# Patient Record
Sex: Female | Born: 1935
Health system: Southern US, Community
[De-identification: ages and names within clinical notes are randomized; demographics above are authoritative.]

## PROBLEM LIST (undated history)

## (undated) DIAGNOSIS — M1990 Unspecified osteoarthritis, unspecified site: Secondary | ICD-10-CM

## (undated) DIAGNOSIS — E119 Type 2 diabetes mellitus without complications: Secondary | ICD-10-CM

## (undated) DIAGNOSIS — I10 Essential (primary) hypertension: Secondary | ICD-10-CM

## (undated) HISTORY — PX: KNEE SURGERY: SHX244

---

## 2004-11-23 ENCOUNTER — Emergency Department: Payer: Self-pay | Admitting: Emergency Medicine

## 2004-11-27 ENCOUNTER — Emergency Department (HOSPITAL_COMMUNITY): Admission: EM | Admit: 2004-11-27 | Discharge: 2004-11-27 | Payer: Self-pay | Admitting: Family Medicine

## 2006-11-19 ENCOUNTER — Emergency Department (HOSPITAL_COMMUNITY): Admission: EM | Admit: 2006-11-19 | Discharge: 2006-11-19 | Payer: Self-pay | Admitting: Emergency Medicine

## 2007-01-28 ENCOUNTER — Emergency Department (HOSPITAL_COMMUNITY): Admission: EM | Admit: 2007-01-28 | Discharge: 2007-01-28 | Payer: Self-pay | Admitting: Emergency Medicine

## 2007-12-24 ENCOUNTER — Emergency Department (HOSPITAL_COMMUNITY): Admission: EM | Admit: 2007-12-24 | Discharge: 2007-12-24 | Payer: Self-pay | Admitting: Family Medicine

## 2009-01-09 ENCOUNTER — Emergency Department: Payer: Self-pay | Admitting: Emergency Medicine

## 2011-01-26 ENCOUNTER — Inpatient Hospital Stay (INDEPENDENT_AMBULATORY_CARE_PROVIDER_SITE_OTHER)
Admission: RE | Admit: 2011-01-26 | Discharge: 2011-01-26 | Disposition: A | Discharge status: Home / Self Care | Payer: Medicare Other | Source: Ambulatory Visit | Attending: Physician Assistant | Admitting: Physician Assistant

## 2011-01-26 DIAGNOSIS — J019 Acute sinusitis, unspecified: Secondary | ICD-10-CM

## 2012-06-02 DIAGNOSIS — E119 Type 2 diabetes mellitus without complications: Secondary | ICD-10-CM | POA: Diagnosis not present

## 2012-06-02 DIAGNOSIS — D485 Neoplasm of uncertain behavior of skin: Secondary | ICD-10-CM | POA: Diagnosis not present

## 2012-06-02 DIAGNOSIS — F0390 Unspecified dementia without behavioral disturbance: Secondary | ICD-10-CM | POA: Diagnosis not present

## 2012-06-02 DIAGNOSIS — I10 Essential (primary) hypertension: Secondary | ICD-10-CM | POA: Diagnosis not present

## 2012-08-26 DIAGNOSIS — M810 Age-related osteoporosis without current pathological fracture: Secondary | ICD-10-CM | POA: Diagnosis not present

## 2012-08-26 DIAGNOSIS — M069 Rheumatoid arthritis, unspecified: Secondary | ICD-10-CM | POA: Diagnosis not present

## 2012-08-26 DIAGNOSIS — Z23 Encounter for immunization: Secondary | ICD-10-CM | POA: Diagnosis not present

## 2012-09-17 DIAGNOSIS — E119 Type 2 diabetes mellitus without complications: Secondary | ICD-10-CM | POA: Diagnosis not present

## 2012-09-17 DIAGNOSIS — I10 Essential (primary) hypertension: Secondary | ICD-10-CM | POA: Diagnosis not present

## 2012-09-17 DIAGNOSIS — D485 Neoplasm of uncertain behavior of skin: Secondary | ICD-10-CM | POA: Diagnosis not present

## 2012-10-08 DIAGNOSIS — Z96659 Presence of unspecified artificial knee joint: Secondary | ICD-10-CM | POA: Diagnosis not present

## 2012-10-08 DIAGNOSIS — R4182 Altered mental status, unspecified: Secondary | ICD-10-CM | POA: Diagnosis not present

## 2012-10-08 DIAGNOSIS — I10 Essential (primary) hypertension: Secondary | ICD-10-CM | POA: Diagnosis not present

## 2012-10-08 DIAGNOSIS — R404 Transient alteration of awareness: Secondary | ICD-10-CM | POA: Diagnosis not present

## 2012-10-08 DIAGNOSIS — Z794 Long term (current) use of insulin: Secondary | ICD-10-CM | POA: Diagnosis not present

## 2012-10-08 DIAGNOSIS — E162 Hypoglycemia, unspecified: Secondary | ICD-10-CM | POA: Diagnosis not present

## 2012-10-08 DIAGNOSIS — E1169 Type 2 diabetes mellitus with other specified complication: Secondary | ICD-10-CM | POA: Diagnosis not present

## 2013-04-30 DIAGNOSIS — E119 Type 2 diabetes mellitus without complications: Secondary | ICD-10-CM | POA: Diagnosis not present

## 2013-04-30 DIAGNOSIS — Z961 Presence of intraocular lens: Secondary | ICD-10-CM | POA: Diagnosis not present

## 2013-05-10 DIAGNOSIS — E785 Hyperlipidemia, unspecified: Secondary | ICD-10-CM | POA: Diagnosis not present

## 2013-05-10 DIAGNOSIS — E119 Type 2 diabetes mellitus without complications: Secondary | ICD-10-CM | POA: Diagnosis not present

## 2013-05-10 DIAGNOSIS — G309 Alzheimer's disease, unspecified: Secondary | ICD-10-CM | POA: Diagnosis not present

## 2013-05-10 DIAGNOSIS — F0280 Dementia in other diseases classified elsewhere without behavioral disturbance: Secondary | ICD-10-CM | POA: Diagnosis not present

## 2013-05-10 DIAGNOSIS — M1710 Unilateral primary osteoarthritis, unspecified knee: Secondary | ICD-10-CM | POA: Diagnosis not present

## 2013-05-10 DIAGNOSIS — M069 Rheumatoid arthritis, unspecified: Secondary | ICD-10-CM | POA: Diagnosis not present

## 2013-05-10 DIAGNOSIS — I10 Essential (primary) hypertension: Secondary | ICD-10-CM | POA: Diagnosis not present

## 2013-05-10 DIAGNOSIS — H6120 Impacted cerumen, unspecified ear: Secondary | ICD-10-CM | POA: Diagnosis not present

## 2013-05-26 DIAGNOSIS — H6120 Impacted cerumen, unspecified ear: Secondary | ICD-10-CM | POA: Diagnosis not present

## 2013-08-27 DIAGNOSIS — E785 Hyperlipidemia, unspecified: Secondary | ICD-10-CM | POA: Diagnosis not present

## 2013-08-27 DIAGNOSIS — E119 Type 2 diabetes mellitus without complications: Secondary | ICD-10-CM | POA: Diagnosis not present

## 2013-08-27 DIAGNOSIS — I10 Essential (primary) hypertension: Secondary | ICD-10-CM | POA: Diagnosis not present

## 2013-08-27 DIAGNOSIS — M1710 Unilateral primary osteoarthritis, unspecified knee: Secondary | ICD-10-CM | POA: Diagnosis not present

## 2013-08-27 DIAGNOSIS — Z1331 Encounter for screening for depression: Secondary | ICD-10-CM | POA: Diagnosis not present

## 2013-08-27 DIAGNOSIS — M069 Rheumatoid arthritis, unspecified: Secondary | ICD-10-CM | POA: Diagnosis not present

## 2013-08-27 DIAGNOSIS — L723 Sebaceous cyst: Secondary | ICD-10-CM | POA: Diagnosis not present

## 2013-08-27 DIAGNOSIS — H6120 Impacted cerumen, unspecified ear: Secondary | ICD-10-CM | POA: Diagnosis not present

## 2013-09-24 DIAGNOSIS — Z96659 Presence of unspecified artificial knee joint: Secondary | ICD-10-CM | POA: Diagnosis not present

## 2014-03-21 DIAGNOSIS — M069 Rheumatoid arthritis, unspecified: Secondary | ICD-10-CM | POA: Diagnosis not present

## 2014-03-21 DIAGNOSIS — IMO0001 Reserved for inherently not codable concepts without codable children: Secondary | ICD-10-CM | POA: Diagnosis not present

## 2014-03-21 DIAGNOSIS — L723 Sebaceous cyst: Secondary | ICD-10-CM | POA: Diagnosis not present

## 2014-03-21 DIAGNOSIS — F0280 Dementia in other diseases classified elsewhere without behavioral disturbance: Secondary | ICD-10-CM | POA: Diagnosis not present

## 2014-03-21 DIAGNOSIS — Z79899 Other long term (current) drug therapy: Secondary | ICD-10-CM | POA: Diagnosis not present

## 2014-03-21 DIAGNOSIS — Z6833 Body mass index (BMI) 33.0-33.9, adult: Secondary | ICD-10-CM | POA: Diagnosis not present

## 2014-03-21 DIAGNOSIS — I10 Essential (primary) hypertension: Secondary | ICD-10-CM | POA: Diagnosis not present

## 2014-03-21 DIAGNOSIS — G309 Alzheimer's disease, unspecified: Secondary | ICD-10-CM | POA: Diagnosis not present

## 2014-04-01 DIAGNOSIS — L723 Sebaceous cyst: Secondary | ICD-10-CM | POA: Diagnosis not present

## 2014-04-18 DIAGNOSIS — M25519 Pain in unspecified shoulder: Secondary | ICD-10-CM | POA: Diagnosis not present

## 2014-04-18 DIAGNOSIS — Z13820 Encounter for screening for osteoporosis: Secondary | ICD-10-CM | POA: Diagnosis not present

## 2014-04-18 DIAGNOSIS — M069 Rheumatoid arthritis, unspecified: Secondary | ICD-10-CM | POA: Diagnosis not present

## 2014-04-18 DIAGNOSIS — M7989 Other specified soft tissue disorders: Secondary | ICD-10-CM | POA: Diagnosis not present

## 2014-04-18 DIAGNOSIS — M25569 Pain in unspecified knee: Secondary | ICD-10-CM | POA: Diagnosis not present

## 2014-04-29 DIAGNOSIS — Z961 Presence of intraocular lens: Secondary | ICD-10-CM | POA: Diagnosis not present

## 2014-04-29 DIAGNOSIS — E119 Type 2 diabetes mellitus without complications: Secondary | ICD-10-CM | POA: Diagnosis not present

## 2014-04-29 DIAGNOSIS — H26499 Other secondary cataract, unspecified eye: Secondary | ICD-10-CM | POA: Diagnosis not present

## 2014-05-20 DIAGNOSIS — M069 Rheumatoid arthritis, unspecified: Secondary | ICD-10-CM | POA: Diagnosis not present

## 2014-05-20 DIAGNOSIS — M25519 Pain in unspecified shoulder: Secondary | ICD-10-CM | POA: Diagnosis not present

## 2014-05-20 DIAGNOSIS — M1990 Unspecified osteoarthritis, unspecified site: Secondary | ICD-10-CM | POA: Diagnosis not present

## 2014-05-20 DIAGNOSIS — M19019 Primary osteoarthritis, unspecified shoulder: Secondary | ICD-10-CM | POA: Diagnosis not present

## 2014-05-20 DIAGNOSIS — M7989 Other specified soft tissue disorders: Secondary | ICD-10-CM | POA: Diagnosis not present

## 2014-06-14 ENCOUNTER — Emergency Department (HOSPITAL_COMMUNITY): Payer: MEDICARE

## 2014-06-14 ENCOUNTER — Emergency Department (HOSPITAL_COMMUNITY)
Admission: EM | Admit: 2014-06-14 | Discharge: 2014-06-14 | Disposition: A | Discharge status: Home / Self Care | Payer: MEDICARE | Attending: Emergency Medicine | Admitting: Emergency Medicine

## 2014-06-14 ENCOUNTER — Encounter (HOSPITAL_COMMUNITY): Payer: Self-pay | Admitting: Emergency Medicine

## 2014-06-14 ENCOUNTER — Emergency Department (INDEPENDENT_AMBULATORY_CARE_PROVIDER_SITE_OTHER)
Admission: EM | Admit: 2014-06-14 | Discharge: 2014-06-14 | Disposition: A | Discharge status: Hospice | Payer: MEDICARE | Source: Home / Self Care | Attending: Family Medicine | Admitting: Family Medicine

## 2014-06-14 DIAGNOSIS — R0789 Other chest pain: Secondary | ICD-10-CM | POA: Diagnosis not present

## 2014-06-14 DIAGNOSIS — R079 Chest pain, unspecified: Secondary | ICD-10-CM | POA: Diagnosis not present

## 2014-06-14 DIAGNOSIS — R071 Chest pain on breathing: Secondary | ICD-10-CM | POA: Diagnosis not present

## 2014-06-14 DIAGNOSIS — E119 Type 2 diabetes mellitus without complications: Secondary | ICD-10-CM | POA: Diagnosis not present

## 2014-06-14 DIAGNOSIS — Z8739 Personal history of other diseases of the musculoskeletal system and connective tissue: Secondary | ICD-10-CM | POA: Diagnosis not present

## 2014-06-14 DIAGNOSIS — I10 Essential (primary) hypertension: Secondary | ICD-10-CM | POA: Insufficient documentation

## 2014-06-14 DIAGNOSIS — I509 Heart failure, unspecified: Secondary | ICD-10-CM | POA: Diagnosis not present

## 2014-06-14 DIAGNOSIS — Z79899 Other long term (current) drug therapy: Secondary | ICD-10-CM | POA: Insufficient documentation

## 2014-06-14 DIAGNOSIS — J9819 Other pulmonary collapse: Secondary | ICD-10-CM | POA: Diagnosis not present

## 2014-06-14 DIAGNOSIS — Z794 Long term (current) use of insulin: Secondary | ICD-10-CM | POA: Diagnosis not present

## 2014-06-14 HISTORY — DX: Essential (primary) hypertension: I10

## 2014-06-14 HISTORY — DX: Type 2 diabetes mellitus without complications: E11.9

## 2014-06-14 HISTORY — DX: Unspecified osteoarthritis, unspecified site: M19.90

## 2014-06-14 LAB — I-STAT CHEM 8, ED
BUN: 11 mg/dL (ref 6–23)
Calcium, Ion: 1.25 mmol/L (ref 1.13–1.30)
Chloride: 105 mEq/L (ref 96–112)
Creatinine, Ser: 0.9 mg/dL (ref 0.50–1.10)
Glucose, Bld: 78 mg/dL (ref 70–99)
HEMATOCRIT: 47 % — AB (ref 36.0–46.0)
HEMOGLOBIN: 16 g/dL — AB (ref 12.0–15.0)
Potassium: 4.3 mEq/L (ref 3.7–5.3)
SODIUM: 137 meq/L (ref 137–147)
TCO2: 26 mmol/L (ref 0–100)

## 2014-06-14 LAB — I-STAT TROPONIN, ED: TROPONIN I, POC: 0.02 ng/mL (ref 0.00–0.08)

## 2014-06-14 LAB — PRO B NATRIURETIC PEPTIDE: Pro B Natriuretic peptide (BNP): 169.8 pg/mL (ref 0–450)

## 2014-06-14 MED ORDER — ASPIRIN 81 MG PO CHEW
CHEWABLE_TABLET | ORAL | Status: AC
  Filled 2014-06-14: qty 4

## 2014-06-14 MED ORDER — NITROGLYCERIN 0.4 MG SL SUBL
SUBLINGUAL_TABLET | SUBLINGUAL | Status: AC
  Filled 2014-06-14: qty 1

## 2014-06-14 NOTE — ED Provider Notes (Signed)
CSN: [id number]     Arrival date & time 06/14/14  1107 History   First MD Initiated Contact with Patient 06/14/14 1128     Chief Complaint  Patient presents with  . Chest Pain   (Consider location/radiation/quality/duration/timing/severity/associated sxs/prior Treatment) Patient is a 80 y.o. female presenting with chest pain. The history is provided by the patient and a relative.  Chest Pain Pain location:  L lateral chest Pain quality: sharp   Pain quality: no tightness   Pain radiates to:  Does not radiate Pain radiates to the back: no   Pain severity:  Mild Onset quality:  Sudden Chronicity:  Recurrent (similar episode last week,) Context comment:  Onset this am on awakening of left sided cp, no assoc sx. Relieved by:  None tried Worsened by:  Nothing tried Ineffective treatments:  None tried Associated symptoms: no abdominal pain, no back pain, no dizziness, no fever, no nausea, no palpitations and no shortness of breath   Risk factors comment:  Has rheumatoid arthr. pos f/hx of cardiac disease.   History reviewed. No pertinent past medical history. No past surgical history on file. No family history on file. History  Substance Use Topics  . Smoking status: Not on file  . Smokeless tobacco: Not on file  . Alcohol Use: Not on file   OB History   Grav Para Term Preterm Abortions TAB SAB Ect Mult Living                 Review of Systems  Constitutional: Negative.  Negative for fever.  Respiratory: Negative for chest tightness, shortness of breath and wheezing.   Cardiovascular: Positive for chest pain. Negative for palpitations and leg swelling.  Gastrointestinal: Negative.  Negative for nausea and abdominal pain.  Musculoskeletal: Negative for back pain.  Neurological: Negative for dizziness.    Allergies  Review of patient's allergies indicates no known allergies.  Home Medications   Prior to Admission medications   Medication Sig Start Date End Date Taking?  Authorizing Provider  folic acid (FOLVITE) 1 MG tablet Take 1 mg by mouth daily.   Yes Historical Provider, MD  hydrALAZINE (APRESOLINE) 50 MG tablet Take 50 mg by mouth 3 (three) times daily.   Yes Historical Provider, MD  hydrochlorothiazide (HYDRODIURIL) 25 MG tablet Take 25 mg by mouth daily.   Yes Historical Provider, MD  lisinopril (PRINIVIL,ZESTRIL) 20 MG tablet Take 20 mg by mouth daily.   Yes Historical Provider, MD  methotrexate 2.5 MG tablet Take by mouth 3 (three) times a week.   Yes Historical Provider, MD  metoprolol succinate (TOPROL-XL) 100 MG 24 hr tablet Take 100 mg by mouth daily. Take with or immediately following a meal.   Yes Historical Provider, MD  pravastatin (PRAVACHOL) 80 MG tablet Take 80 mg by mouth daily.   Yes Historical Provider, MD   BP 159/97  Pulse 62  Temp(Src) 97.6 F (36.4 C) (Oral)  Resp 16  SpO2 96% Physical Exam  Nursing note and vitals reviewed. Constitutional: She is oriented to person, place, and time. She appears well-developed and well-nourished.  Eyes: Conjunctivae are normal. Pupils are equal, round, and reactive to light.  Neck: Normal range of motion. Neck supple.  Cardiovascular: Normal rate, regular rhythm, normal heart sounds and intact distal pulses.   Pulmonary/Chest: Effort normal and breath sounds normal.  Abdominal: Soft. There is no tenderness.  Musculoskeletal: She exhibits no edema.  Lymphadenopathy:    She has no cervical adenopathy.  Neurological: She is alert  and oriented to person, place, and time.  Skin: Skin is warm and dry.    ED Course  Procedures (including critical care time) Labs Review Labs Reviewed - No data to display  Imaging Review No results found.   MDM   1. Atypical chest pain    Sent for eval of 2nd episode of left sided cp, ecg-wnl, pos fam hx of heart problems, onset this am , in nad.    Billy Fischer, MD 06/14/14 [phone]

## 2014-06-14 NOTE — ED Notes (Signed)
Patient transported to X-ray 

## 2014-06-14 NOTE — ED Notes (Signed)
Daughter had reconsidered IV, transport. While this Probation officer in room assessing iv placement, daughter again decided to refuse IV placement

## 2014-06-14 NOTE — ED Notes (Addendum)
This writer went in to exam room to start SOP monitor, IV, oxygen, NTG for c/o chest pain, daughter declined to allow RN to start , as she wanted to check w PCP office . "After all, she has a 3 pm appointment to see her own MD", after brief discussion w daughter as to the benefits of therapy, still would not allow writer to continue

## 2014-06-14 NOTE — Discharge Instructions (Signed)
Chest Wall Pain °Chest wall pain is pain felt in or around the chest bones and muscles. It may take up to 6 weeks to get better. It may take longer if you are active. Chest wall pain can happen on its own. Other times, things like germs, injury, coughing, or exercise can cause the pain. °HOME CARE  °· Avoid activities that make you tired or cause pain. Try not to use your chest, belly (abdominal), or side muscles. Do not use heavy weights. °· Put ice on the sore area. °¨ Put ice in a plastic bag. °¨ Place a towel between your skin and the bag. °¨ Leave the ice on for 15-20 minutes for the first 2 days. °· Only take medicine as told by your doctor. °GET HELP RIGHT AWAY IF:  °· You have more pain or are very uncomfortable. °· You have a fever. °· Your chest pain gets worse. °· You have new problems. °· You feel sick to your stomach (nauseous) or throw up (vomit). °· You start to sweat or feel lightheaded. °· You have a cough with mucus (phlegm). °· You cough up blood. °MAKE SURE YOU:  °· Understand these instructions. °· Will watch your condition. °· Will get help right away if you are not doing well or get worse. °Document Released: 05/13/2008 Document Revised: 02/17/2012 Document Reviewed: 07/22/2011 °[employer]® Patient Information ©2015 [employer]. This information is not intended to replace advice given to you by your health care provider. Make sure you discuss any questions you have with your health care provider. ° °

## 2014-06-14 NOTE — ED Notes (Signed)
Pt via [facility] from urgent care for evaluation of cp. Pt denies any complaints at this time. Per daughter pt c/o pain in left chest increased with inspiration. Pt unable to describe nature of pain. Denies sob, diaphoresis or nausea.

## 2014-06-14 NOTE — ED Notes (Signed)
Report to [facility] , en rout to transport patient

## 2014-06-14 NOTE — ED Notes (Signed)
C/o chest pain 

## 2014-06-14 NOTE — ED Provider Notes (Signed)
CSN: [id number]     Arrival date & time 06/14/14  1317 History   First MD Initiated Contact with Patient 06/14/14 1321     Chief Complaint  Patient presents with  . Chest Pain      HPI  Pt via [facility] from urgent care for evaluation of cp. Pt denies any complaints at this time. Per daughter pt c/o pain in left chest increased with inspiration. Pt unable describe nature of pain. Denies sob, diaphoresis or nausea.  Patient had similar episode last week and went away with some Aleve.   Patient denies any leg swelling. Past Medical History  Diagnosis Date  . Diabetes mellitus without complication   . Hypertension   . Arthritis    Past Surgical History  Procedure Laterality Date  . Knee surgery     Family History  Problem Relation Age of Onset  . Diabetes Mother   . Hypertension Mother   . Diabetes Father   . Hypertension Father    History  Substance Use Topics  . Smoking status: Never Smoker   . Smokeless tobacco: Not on file  . Alcohol Use: No   OB History   Grav Para Term Preterm Abortions TAB SAB Ect Mult Living                 Review of Systems  All other systems reviewed and are negative   Allergies  Review of patient's allergies indicates no known allergies.  Home Medications   Prior to Admission medications   Medication Sig Start Date End Date Taking? Authorizing Provider  folic acid (FOLVITE) 1 MG tablet Take 1 mg by mouth daily.   Yes Historical Provider, MD  hydrALAZINE (APRESOLINE) 50 MG tablet Take 50 mg by mouth 2 (two) times daily.    Yes Historical Provider, MD  hydrochlorothiazide (HYDRODIURIL) 25 MG tablet Take 25 mg by mouth daily.   Yes Historical Provider, MD  insulin NPH Human (HUMULIN N,NOVOLIN N) 100 UNIT/ML injection Inject 45 Units into the skin 2 (two) times daily before a meal.   Yes Historical Provider, MD  lisinopril (PRINIVIL,ZESTRIL) 20 MG tablet Take 20 mg by mouth daily.   Yes Historical Provider, MD  methotrexate 2.5 MG tablet  Take 20 mg by mouth once a week. Sunday   Yes Historical Provider, MD  metoprolol succinate (TOPROL-XL) 100 MG 24 hr tablet Take 100 mg by mouth daily. Take with or immediately following a meal.   Yes Historical Provider, MD  pravastatin (PRAVACHOL) 80 MG tablet Take 80 mg by mouth daily.   Yes Historical Provider, MD   BP 171/59  Pulse 60  Resp 16  SpO2 100% Physical Exam Physical Exam  Nursing note and vitals reviewed. Constitutional: She is oriented to person, place, and time. She appears well-developed and well-nourished. No distress.  HENT:  Head: Normocephalic and atraumatic.  Eyes: Pupils are equal, round, and reactive to light.  Neck: Normal range of motion.  no JVD noted Cardiovascular: Normal rate and intact distal pulses.   Pulmonary/Chest: No respiratory distress.  Some very mild reproducible chest wall tenderness left sternal border  Abdominal: Normal appearance. She exhibits no distension.  Musculoskeletal: Normal range of motion.  no significant pedal edema noted Neurological: She is alert and oriented to person, place, and time. No cranial nerve deficit.  Skin: Skin is warm and dry. No rash noted.  Psychiatric: She has a normal mood and affect. Her behavior is normal.   ED Course  Procedures (including critical  care time) Labs Review Labs Reviewed  I-STAT CHEM 8, ED - Abnormal; Notable for the following:    Hemoglobin 16.0 (*)    HCT 47.0 (*)    All other components within normal limits  PRO B NATRIURETIC PEPTIDE  I-STAT TROPOININ, ED    Imaging Review Dg Chest 2 View  06/14/2014   CLINICAL DATA:  Left-sided chest pain suspect CHF  EXAM: CHEST  2 VIEW  COMPARISON:  Portable chest x-ray of earlier today  FINDINGS: The lungs are adequately inflated. There is minimal atelectasis at the right lung base anteriorly. The cardiac silhouette is enlarged and the pulmonary vascularity is prominent centrally without definite cephalization. There is no pleural effusion. There  is tortuosity of the descending thoracic aorta.  IMPRESSION: Low-grade compensated CHF and right basilar atelectasis are present.   Electronically Signed   By: David  Martinique   On: 06/14/2014 15:20   Dg Chest Portable 1 View  06/14/2014   CLINICAL DATA:  Left-sided chest pain with history of hypertension and diabetes  EXAM: PORTABLE CHEST - 1 VIEW  COMPARISON:  None.  FINDINGS: The lungs are adequately inflated. The interstitial markings are coarse. The cardiac silhouette is enlarged. The pulmonary vascularity is mildly prominent centrally. There is no pleural effusion. There is tortuosity of the descending thoracic aorta. There degenerative changes of the shoulders.  IMPRESSION: There is mild cardiomegaly with possible low-grade compensated CHF. If the patient can tolerate the procedure, a PA and lateral chest x-ray would be of value.   Electronically Signed   By: David  [place]ique   On: 06/14/2014 13:56     EKG Interpretation  Date: 06/14/2014  Rate: 61  Rhythm: normal sinus rhythm  QRS Axis: normal  Intervals: normal  ST/T Wave abnormalities: normal  Conduction Disutrbances: none  Narrative Interpretation: Nonspecific EKG        MDM   Final diagnoses:  Chest wall pain        Dot Lanes, MD 06/14/14 1556

## 2014-06-14 NOTE — ED Notes (Signed)
Pt alert x4 respirations easy non labored.  

## 2014-06-16 DIAGNOSIS — R071 Chest pain on breathing: Secondary | ICD-10-CM | POA: Diagnosis not present

## 2014-06-16 DIAGNOSIS — I10 Essential (primary) hypertension: Secondary | ICD-10-CM | POA: Diagnosis not present

## 2014-06-16 DIAGNOSIS — Z79899 Other long term (current) drug therapy: Secondary | ICD-10-CM | POA: Diagnosis not present

## 2014-06-16 DIAGNOSIS — Z6833 Body mass index (BMI) 33.0-33.9, adult: Secondary | ICD-10-CM | POA: Diagnosis not present

## 2014-06-17 ENCOUNTER — Other Ambulatory Visit (HOSPITAL_COMMUNITY): Payer: Self-pay | Admitting: Internal Medicine

## 2014-06-17 ENCOUNTER — Ambulatory Visit (HOSPITAL_COMMUNITY)
Admission: RE | Admit: 2014-06-17 | Discharge: 2014-06-17 | Disposition: A | Discharge status: Home / Self Care | Payer: MEDICARE | Source: Ambulatory Visit | Attending: Vascular Surgery | Admitting: Vascular Surgery

## 2014-06-17 DIAGNOSIS — R609 Edema, unspecified: Secondary | ICD-10-CM | POA: Insufficient documentation

## 2014-06-17 DIAGNOSIS — M79609 Pain in unspecified limb: Secondary | ICD-10-CM | POA: Diagnosis not present

## 2014-06-17 DIAGNOSIS — R600 Localized edema: Secondary | ICD-10-CM

## 2014-06-17 DIAGNOSIS — M79604 Pain in right leg: Secondary | ICD-10-CM

## 2014-06-17 DIAGNOSIS — M79605 Pain in left leg: Secondary | ICD-10-CM

## 2014-07-22 DIAGNOSIS — M25519 Pain in unspecified shoulder: Secondary | ICD-10-CM | POA: Diagnosis not present

## 2014-07-22 DIAGNOSIS — M7989 Other specified soft tissue disorders: Secondary | ICD-10-CM | POA: Diagnosis not present

## 2014-07-22 DIAGNOSIS — M1990 Unspecified osteoarthritis, unspecified site: Secondary | ICD-10-CM | POA: Diagnosis not present

## 2014-07-22 DIAGNOSIS — M069 Rheumatoid arthritis, unspecified: Secondary | ICD-10-CM | POA: Diagnosis not present

## 2014-08-29 ENCOUNTER — Other Ambulatory Visit: Payer: Self-pay

## 2014-08-29 DIAGNOSIS — Z1231 Encounter for screening mammogram for malignant neoplasm of breast: Secondary | ICD-10-CM

## 2014-09-02 ENCOUNTER — Ambulatory Visit: Payer: MEDICARE

## 2014-09-09 ENCOUNTER — Ambulatory Visit: Payer: MEDICARE

## 2014-09-30 ENCOUNTER — Ambulatory Visit
Admission: RE | Admit: 2014-09-30 | Discharge: 2014-09-30 | Disposition: A | Discharge status: Home / Self Care | Payer: MEDICARE | Source: Ambulatory Visit

## 2014-09-30 DIAGNOSIS — Z1231 Encounter for screening mammogram for malignant neoplasm of breast: Secondary | ICD-10-CM | POA: Diagnosis not present

## 2014-09-30 DIAGNOSIS — Z23 Encounter for immunization: Secondary | ICD-10-CM | POA: Diagnosis not present

## 2014-10-10 DIAGNOSIS — R32 Unspecified urinary incontinence: Secondary | ICD-10-CM | POA: Diagnosis not present

## 2014-10-10 DIAGNOSIS — I119 Hypertensive heart disease without heart failure: Secondary | ICD-10-CM | POA: Diagnosis not present

## 2014-10-10 DIAGNOSIS — E785 Hyperlipidemia, unspecified: Secondary | ICD-10-CM | POA: Diagnosis not present

## 2014-10-10 DIAGNOSIS — E119 Type 2 diabetes mellitus without complications: Secondary | ICD-10-CM | POA: Diagnosis not present

## 2014-12-09 DIAGNOSIS — E119 Type 2 diabetes mellitus without complications: Secondary | ICD-10-CM | POA: Diagnosis not present

## 2014-12-09 DIAGNOSIS — R32 Unspecified urinary incontinence: Secondary | ICD-10-CM | POA: Diagnosis not present

## 2014-12-09 DIAGNOSIS — I119 Hypertensive heart disease without heart failure: Secondary | ICD-10-CM | POA: Diagnosis not present

## 2014-12-09 DIAGNOSIS — E785 Hyperlipidemia, unspecified: Secondary | ICD-10-CM | POA: Diagnosis not present

## 2015-02-07 DIAGNOSIS — E119 Type 2 diabetes mellitus without complications: Secondary | ICD-10-CM | POA: Diagnosis not present

## 2015-02-07 DIAGNOSIS — E785 Hyperlipidemia, unspecified: Secondary | ICD-10-CM | POA: Diagnosis not present

## 2015-02-07 DIAGNOSIS — I119 Hypertensive heart disease without heart failure: Secondary | ICD-10-CM | POA: Diagnosis not present

## 2015-02-07 DIAGNOSIS — R32 Unspecified urinary incontinence: Secondary | ICD-10-CM | POA: Diagnosis not present

## 2015-02-13 DIAGNOSIS — G309 Alzheimer's disease, unspecified: Secondary | ICD-10-CM | POA: Diagnosis not present

## 2015-02-13 DIAGNOSIS — Z6833 Body mass index (BMI) 33.0-33.9, adult: Secondary | ICD-10-CM | POA: Diagnosis not present

## 2015-02-13 DIAGNOSIS — E119 Type 2 diabetes mellitus without complications: Secondary | ICD-10-CM | POA: Diagnosis not present

## 2015-02-13 DIAGNOSIS — M069 Rheumatoid arthritis, unspecified: Secondary | ICD-10-CM | POA: Diagnosis not present

## 2015-02-13 DIAGNOSIS — E785 Hyperlipidemia, unspecified: Secondary | ICD-10-CM | POA: Diagnosis not present

## 2015-02-13 DIAGNOSIS — E1165 Type 2 diabetes mellitus with hyperglycemia: Secondary | ICD-10-CM | POA: Diagnosis not present

## 2015-02-13 DIAGNOSIS — I10 Essential (primary) hypertension: Secondary | ICD-10-CM | POA: Diagnosis not present

## 2015-03-17 DIAGNOSIS — M25512 Pain in left shoulder: Secondary | ICD-10-CM | POA: Diagnosis not present

## 2015-03-17 DIAGNOSIS — M150 Primary generalized (osteo)arthritis: Secondary | ICD-10-CM | POA: Diagnosis not present

## 2015-03-17 DIAGNOSIS — M8580 Other specified disorders of bone density and structure, unspecified site: Secondary | ICD-10-CM | POA: Diagnosis not present

## 2015-03-17 DIAGNOSIS — M0589 Other rheumatoid arthritis with rheumatoid factor of multiple sites: Secondary | ICD-10-CM | POA: Diagnosis not present

## 2015-03-23 DIAGNOSIS — E161 Other hypoglycemia: Secondary | ICD-10-CM | POA: Diagnosis not present

## 2015-04-08 DIAGNOSIS — I119 Hypertensive heart disease without heart failure: Secondary | ICD-10-CM | POA: Diagnosis not present

## 2015-04-08 DIAGNOSIS — E785 Hyperlipidemia, unspecified: Secondary | ICD-10-CM | POA: Diagnosis not present

## 2015-04-08 DIAGNOSIS — E119 Type 2 diabetes mellitus without complications: Secondary | ICD-10-CM | POA: Diagnosis not present

## 2015-04-08 DIAGNOSIS — R32 Unspecified urinary incontinence: Secondary | ICD-10-CM | POA: Diagnosis not present

## 2015-04-28 DIAGNOSIS — L720 Epidermal cyst: Secondary | ICD-10-CM | POA: Diagnosis not present

## 2015-05-17 DIAGNOSIS — H26493 Other secondary cataract, bilateral: Secondary | ICD-10-CM | POA: Diagnosis not present

## 2015-05-17 DIAGNOSIS — Z961 Presence of intraocular lens: Secondary | ICD-10-CM | POA: Diagnosis not present

## 2015-05-17 DIAGNOSIS — E119 Type 2 diabetes mellitus without complications: Secondary | ICD-10-CM | POA: Diagnosis not present

## 2015-06-07 DIAGNOSIS — R32 Unspecified urinary incontinence: Secondary | ICD-10-CM | POA: Diagnosis not present

## 2015-06-07 DIAGNOSIS — I119 Hypertensive heart disease without heart failure: Secondary | ICD-10-CM | POA: Diagnosis not present

## 2015-06-07 DIAGNOSIS — E785 Hyperlipidemia, unspecified: Secondary | ICD-10-CM | POA: Diagnosis not present

## 2015-06-07 DIAGNOSIS — E119 Type 2 diabetes mellitus without complications: Secondary | ICD-10-CM | POA: Diagnosis not present

## 2015-06-21 DIAGNOSIS — E11311 Type 2 diabetes mellitus with unspecified diabetic retinopathy with macular edema: Secondary | ICD-10-CM | POA: Diagnosis not present

## 2015-06-21 DIAGNOSIS — E119 Type 2 diabetes mellitus without complications: Secondary | ICD-10-CM | POA: Diagnosis not present

## 2015-06-21 DIAGNOSIS — E11321 Type 2 diabetes mellitus with mild nonproliferative diabetic retinopathy with macular edema: Secondary | ICD-10-CM | POA: Diagnosis not present

## 2015-06-21 DIAGNOSIS — Z961 Presence of intraocular lens: Secondary | ICD-10-CM | POA: Diagnosis not present

## 2015-06-26 ENCOUNTER — Encounter (INDEPENDENT_AMBULATORY_CARE_PROVIDER_SITE_OTHER): Payer: MEDICARE | Admitting: Ophthalmology

## 2015-06-26 DIAGNOSIS — H35033 Hypertensive retinopathy, bilateral: Secondary | ICD-10-CM

## 2015-06-26 DIAGNOSIS — E11311 Type 2 diabetes mellitus with unspecified diabetic retinopathy with macular edema: Secondary | ICD-10-CM | POA: Diagnosis not present

## 2015-06-26 DIAGNOSIS — H43813 Vitreous degeneration, bilateral: Secondary | ICD-10-CM | POA: Diagnosis not present

## 2015-06-26 DIAGNOSIS — E11329 Type 2 diabetes mellitus with mild nonproliferative diabetic retinopathy without macular edema: Secondary | ICD-10-CM | POA: Diagnosis not present

## 2015-06-26 DIAGNOSIS — I10 Essential (primary) hypertension: Secondary | ICD-10-CM

## 2015-06-26 DIAGNOSIS — E11331 Type 2 diabetes mellitus with moderate nonproliferative diabetic retinopathy with macular edema: Secondary | ICD-10-CM

## 2015-06-27 DIAGNOSIS — M0589 Other rheumatoid arthritis with rheumatoid factor of multiple sites: Secondary | ICD-10-CM | POA: Diagnosis not present

## 2015-06-28 DIAGNOSIS — Z6833 Body mass index (BMI) 33.0-33.9, adult: Secondary | ICD-10-CM | POA: Diagnosis not present

## 2015-06-28 DIAGNOSIS — E119 Type 2 diabetes mellitus without complications: Secondary | ICD-10-CM | POA: Diagnosis not present

## 2015-06-28 DIAGNOSIS — H3532 Exudative age-related macular degeneration: Secondary | ICD-10-CM | POA: Diagnosis not present

## 2015-06-28 DIAGNOSIS — I10 Essential (primary) hypertension: Secondary | ICD-10-CM | POA: Diagnosis not present

## 2015-06-28 DIAGNOSIS — E1165 Type 2 diabetes mellitus with hyperglycemia: Secondary | ICD-10-CM | POA: Diagnosis not present

## 2015-06-28 DIAGNOSIS — H538 Other visual disturbances: Secondary | ICD-10-CM | POA: Diagnosis not present

## 2015-07-20 DIAGNOSIS — I10 Essential (primary) hypertension: Secondary | ICD-10-CM | POA: Diagnosis not present

## 2015-07-20 DIAGNOSIS — Z6833 Body mass index (BMI) 33.0-33.9, adult: Secondary | ICD-10-CM | POA: Diagnosis not present

## 2015-07-20 DIAGNOSIS — E1165 Type 2 diabetes mellitus with hyperglycemia: Secondary | ICD-10-CM | POA: Diagnosis not present

## 2015-07-20 DIAGNOSIS — R42 Dizziness and giddiness: Secondary | ICD-10-CM | POA: Diagnosis not present

## 2015-08-08 DIAGNOSIS — I10 Essential (primary) hypertension: Secondary | ICD-10-CM | POA: Diagnosis not present

## 2015-08-08 DIAGNOSIS — Z6832 Body mass index (BMI) 32.0-32.9, adult: Secondary | ICD-10-CM | POA: Diagnosis not present

## 2015-08-10 ENCOUNTER — Encounter (INDEPENDENT_AMBULATORY_CARE_PROVIDER_SITE_OTHER): Payer: MEDICARE | Admitting: Ophthalmology

## 2015-08-10 DIAGNOSIS — H35033 Hypertensive retinopathy, bilateral: Secondary | ICD-10-CM

## 2015-08-10 DIAGNOSIS — E11311 Type 2 diabetes mellitus with unspecified diabetic retinopathy with macular edema: Secondary | ICD-10-CM

## 2015-08-10 DIAGNOSIS — I10 Essential (primary) hypertension: Secondary | ICD-10-CM | POA: Diagnosis not present

## 2015-08-10 DIAGNOSIS — E11329 Type 2 diabetes mellitus with mild nonproliferative diabetic retinopathy without macular edema: Secondary | ICD-10-CM | POA: Diagnosis not present

## 2015-08-10 DIAGNOSIS — E11331 Type 2 diabetes mellitus with moderate nonproliferative diabetic retinopathy with macular edema: Secondary | ICD-10-CM

## 2015-08-10 DIAGNOSIS — H43813 Vitreous degeneration, bilateral: Secondary | ICD-10-CM | POA: Diagnosis not present

## 2015-08-24 DIAGNOSIS — E119 Type 2 diabetes mellitus without complications: Secondary | ICD-10-CM | POA: Diagnosis not present

## 2015-08-24 DIAGNOSIS — I10 Essential (primary) hypertension: Secondary | ICD-10-CM | POA: Diagnosis not present

## 2015-08-24 DIAGNOSIS — E785 Hyperlipidemia, unspecified: Secondary | ICD-10-CM | POA: Diagnosis not present

## 2015-08-28 DIAGNOSIS — I10 Essential (primary) hypertension: Secondary | ICD-10-CM | POA: Diagnosis not present

## 2015-08-28 DIAGNOSIS — E1151 Type 2 diabetes mellitus with diabetic peripheral angiopathy without gangrene: Secondary | ICD-10-CM | POA: Diagnosis not present

## 2015-08-28 DIAGNOSIS — Z1389 Encounter for screening for other disorder: Secondary | ICD-10-CM | POA: Diagnosis not present

## 2015-08-28 DIAGNOSIS — I739 Peripheral vascular disease, unspecified: Secondary | ICD-10-CM | POA: Diagnosis not present

## 2015-08-28 DIAGNOSIS — H3532 Exudative age-related macular degeneration: Secondary | ICD-10-CM | POA: Diagnosis not present

## 2015-08-28 DIAGNOSIS — G309 Alzheimer's disease, unspecified: Secondary | ICD-10-CM | POA: Diagnosis not present

## 2015-08-28 DIAGNOSIS — E785 Hyperlipidemia, unspecified: Secondary | ICD-10-CM | POA: Diagnosis not present

## 2015-08-28 DIAGNOSIS — Z6832 Body mass index (BMI) 32.0-32.9, adult: Secondary | ICD-10-CM | POA: Diagnosis not present

## 2015-08-28 DIAGNOSIS — M069 Rheumatoid arthritis, unspecified: Secondary | ICD-10-CM | POA: Diagnosis not present

## 2015-08-28 DIAGNOSIS — Z0000 Encounter for general adult medical examination without abnormal findings: Secondary | ICD-10-CM | POA: Diagnosis not present

## 2015-09-26 DIAGNOSIS — Z23 Encounter for immunization: Secondary | ICD-10-CM | POA: Diagnosis not present

## 2015-09-27 ENCOUNTER — Other Ambulatory Visit: Payer: Self-pay

## 2015-09-27 DIAGNOSIS — Z1231 Encounter for screening mammogram for malignant neoplasm of breast: Secondary | ICD-10-CM

## 2015-10-02 ENCOUNTER — Ambulatory Visit
Admission: RE | Admit: 2015-10-02 | Discharge: 2015-10-02 | Disposition: A | Discharge status: Home / Self Care | Payer: MEDICARE | Source: Ambulatory Visit

## 2015-10-02 DIAGNOSIS — Z1231 Encounter for screening mammogram for malignant neoplasm of breast: Secondary | ICD-10-CM | POA: Diagnosis not present

## 2015-10-05 DIAGNOSIS — I119 Hypertensive heart disease without heart failure: Secondary | ICD-10-CM | POA: Diagnosis not present

## 2015-10-05 DIAGNOSIS — E119 Type 2 diabetes mellitus without complications: Secondary | ICD-10-CM | POA: Diagnosis not present

## 2015-10-05 DIAGNOSIS — R32 Unspecified urinary incontinence: Secondary | ICD-10-CM | POA: Diagnosis not present

## 2015-10-05 DIAGNOSIS — E785 Hyperlipidemia, unspecified: Secondary | ICD-10-CM | POA: Diagnosis not present

## 2015-10-12 DIAGNOSIS — M0589 Other rheumatoid arthritis with rheumatoid factor of multiple sites: Secondary | ICD-10-CM | POA: Diagnosis not present

## 2015-10-12 DIAGNOSIS — M8580 Other specified disorders of bone density and structure, unspecified site: Secondary | ICD-10-CM | POA: Diagnosis not present

## 2015-10-12 DIAGNOSIS — M150 Primary generalized (osteo)arthritis: Secondary | ICD-10-CM | POA: Diagnosis not present

## 2015-10-12 DIAGNOSIS — M25512 Pain in left shoulder: Secondary | ICD-10-CM | POA: Diagnosis not present

## 2015-12-04 DIAGNOSIS — R32 Unspecified urinary incontinence: Secondary | ICD-10-CM | POA: Diagnosis not present

## 2015-12-04 DIAGNOSIS — E119 Type 2 diabetes mellitus without complications: Secondary | ICD-10-CM | POA: Diagnosis not present

## 2015-12-04 DIAGNOSIS — I119 Hypertensive heart disease without heart failure: Secondary | ICD-10-CM | POA: Diagnosis not present

## 2015-12-04 DIAGNOSIS — E784 Other hyperlipidemia: Secondary | ICD-10-CM | POA: Diagnosis not present

## 2015-12-21 ENCOUNTER — Ambulatory Visit (INDEPENDENT_AMBULATORY_CARE_PROVIDER_SITE_OTHER): Payer: MEDICARE | Admitting: Ophthalmology

## 2015-12-28 ENCOUNTER — Ambulatory Visit (INDEPENDENT_AMBULATORY_CARE_PROVIDER_SITE_OTHER): Payer: MEDICARE | Admitting: Ophthalmology

## 2015-12-28 DIAGNOSIS — H43813 Vitreous degeneration, bilateral: Secondary | ICD-10-CM

## 2015-12-28 DIAGNOSIS — E113311 Type 2 diabetes mellitus with moderate nonproliferative diabetic retinopathy with macular edema, right eye: Secondary | ICD-10-CM

## 2015-12-28 DIAGNOSIS — E11311 Type 2 diabetes mellitus with unspecified diabetic retinopathy with macular edema: Secondary | ICD-10-CM

## 2015-12-28 DIAGNOSIS — I10 Essential (primary) hypertension: Secondary | ICD-10-CM | POA: Diagnosis not present

## 2015-12-28 DIAGNOSIS — E113292 Type 2 diabetes mellitus with mild nonproliferative diabetic retinopathy without macular edema, left eye: Secondary | ICD-10-CM | POA: Diagnosis not present

## 2015-12-28 DIAGNOSIS — H35033 Hypertensive retinopathy, bilateral: Secondary | ICD-10-CM

## 2016-01-04 DIAGNOSIS — I10 Essential (primary) hypertension: Secondary | ICD-10-CM | POA: Diagnosis not present

## 2016-01-04 DIAGNOSIS — E784 Other hyperlipidemia: Secondary | ICD-10-CM | POA: Diagnosis not present

## 2016-01-04 DIAGNOSIS — Z1389 Encounter for screening for other disorder: Secondary | ICD-10-CM | POA: Diagnosis not present

## 2016-01-04 DIAGNOSIS — G308 Other Alzheimer's disease: Secondary | ICD-10-CM | POA: Diagnosis not present

## 2016-01-04 DIAGNOSIS — Z6833 Body mass index (BMI) 33.0-33.9, adult: Secondary | ICD-10-CM | POA: Diagnosis not present

## 2016-01-04 DIAGNOSIS — I739 Peripheral vascular disease, unspecified: Secondary | ICD-10-CM | POA: Diagnosis not present

## 2016-01-04 DIAGNOSIS — E1151 Type 2 diabetes mellitus with diabetic peripheral angiopathy without gangrene: Secondary | ICD-10-CM | POA: Diagnosis not present

## 2016-01-04 DIAGNOSIS — M179 Osteoarthritis of knee, unspecified: Secondary | ICD-10-CM | POA: Diagnosis not present

## 2016-01-25 DIAGNOSIS — Z6833 Body mass index (BMI) 33.0-33.9, adult: Secondary | ICD-10-CM | POA: Diagnosis not present

## 2016-01-25 DIAGNOSIS — J209 Acute bronchitis, unspecified: Secondary | ICD-10-CM | POA: Diagnosis not present

## 2016-01-25 DIAGNOSIS — R05 Cough: Secondary | ICD-10-CM | POA: Diagnosis not present

## 2016-02-02 DIAGNOSIS — E784 Other hyperlipidemia: Secondary | ICD-10-CM | POA: Diagnosis not present

## 2016-02-02 DIAGNOSIS — I119 Hypertensive heart disease without heart failure: Secondary | ICD-10-CM | POA: Diagnosis not present

## 2016-02-02 DIAGNOSIS — R32 Unspecified urinary incontinence: Secondary | ICD-10-CM | POA: Diagnosis not present

## 2016-02-02 DIAGNOSIS — E119 Type 2 diabetes mellitus without complications: Secondary | ICD-10-CM | POA: Diagnosis not present

## 2016-02-20 DIAGNOSIS — M150 Primary generalized (osteo)arthritis: Secondary | ICD-10-CM | POA: Diagnosis not present

## 2016-02-20 DIAGNOSIS — M25512 Pain in left shoulder: Secondary | ICD-10-CM | POA: Diagnosis not present

## 2016-02-20 DIAGNOSIS — Z79899 Other long term (current) drug therapy: Secondary | ICD-10-CM | POA: Diagnosis not present

## 2016-02-20 DIAGNOSIS — M8580 Other specified disorders of bone density and structure, unspecified site: Secondary | ICD-10-CM | POA: Diagnosis not present

## 2016-02-20 DIAGNOSIS — M0589 Other rheumatoid arthritis with rheumatoid factor of multiple sites: Secondary | ICD-10-CM | POA: Diagnosis not present

## 2016-04-02 DIAGNOSIS — R32 Unspecified urinary incontinence: Secondary | ICD-10-CM | POA: Diagnosis not present

## 2016-04-02 DIAGNOSIS — I119 Hypertensive heart disease without heart failure: Secondary | ICD-10-CM | POA: Diagnosis not present

## 2016-04-02 DIAGNOSIS — E784 Other hyperlipidemia: Secondary | ICD-10-CM | POA: Diagnosis not present

## 2016-04-02 DIAGNOSIS — E119 Type 2 diabetes mellitus without complications: Secondary | ICD-10-CM | POA: Diagnosis not present

## 2016-04-16 DIAGNOSIS — I7389 Other specified peripheral vascular diseases: Secondary | ICD-10-CM | POA: Diagnosis not present

## 2016-04-16 DIAGNOSIS — G308 Other Alzheimer's disease: Secondary | ICD-10-CM | POA: Diagnosis not present

## 2016-04-16 DIAGNOSIS — E784 Other hyperlipidemia: Secondary | ICD-10-CM | POA: Diagnosis not present

## 2016-04-16 DIAGNOSIS — M0689 Other specified rheumatoid arthritis, multiple sites: Secondary | ICD-10-CM | POA: Diagnosis not present

## 2016-04-16 DIAGNOSIS — E1151 Type 2 diabetes mellitus with diabetic peripheral angiopathy without gangrene: Secondary | ICD-10-CM | POA: Diagnosis not present

## 2016-04-16 DIAGNOSIS — I10 Essential (primary) hypertension: Secondary | ICD-10-CM | POA: Diagnosis not present

## 2016-04-16 DIAGNOSIS — Z6832 Body mass index (BMI) 32.0-32.9, adult: Secondary | ICD-10-CM | POA: Diagnosis not present

## 2016-05-03 DIAGNOSIS — J209 Acute bronchitis, unspecified: Secondary | ICD-10-CM | POA: Diagnosis not present

## 2016-05-03 DIAGNOSIS — Z6833 Body mass index (BMI) 33.0-33.9, adult: Secondary | ICD-10-CM | POA: Diagnosis not present

## 2016-05-23 DIAGNOSIS — H35373 Puckering of macula, bilateral: Secondary | ICD-10-CM | POA: Diagnosis not present

## 2016-05-23 DIAGNOSIS — H26492 Other secondary cataract, left eye: Secondary | ICD-10-CM | POA: Diagnosis not present

## 2016-05-23 DIAGNOSIS — Z961 Presence of intraocular lens: Secondary | ICD-10-CM | POA: Diagnosis not present

## 2016-05-23 DIAGNOSIS — E119 Type 2 diabetes mellitus without complications: Secondary | ICD-10-CM | POA: Diagnosis not present

## 2016-05-23 DIAGNOSIS — H04123 Dry eye syndrome of bilateral lacrimal glands: Secondary | ICD-10-CM | POA: Diagnosis not present

## 2016-05-27 DIAGNOSIS — M069 Rheumatoid arthritis, unspecified: Secondary | ICD-10-CM | POA: Diagnosis not present

## 2016-06-01 DIAGNOSIS — I119 Hypertensive heart disease without heart failure: Secondary | ICD-10-CM | POA: Diagnosis not present

## 2016-06-01 DIAGNOSIS — E784 Other hyperlipidemia: Secondary | ICD-10-CM | POA: Diagnosis not present

## 2016-06-01 DIAGNOSIS — R32 Unspecified urinary incontinence: Secondary | ICD-10-CM | POA: Diagnosis not present

## 2016-06-01 DIAGNOSIS — E119 Type 2 diabetes mellitus without complications: Secondary | ICD-10-CM | POA: Diagnosis not present

## 2016-06-07 IMAGING — CR DG CHEST 1V PORT
1 series · 1 of 1 positions shown · non-contrast
Comparison: None.

CLINICAL DATA: Left-sided chest pain with history of hypertension
and diabetes

EXAM:
PORTABLE CHEST - 1 VIEW

[AP]
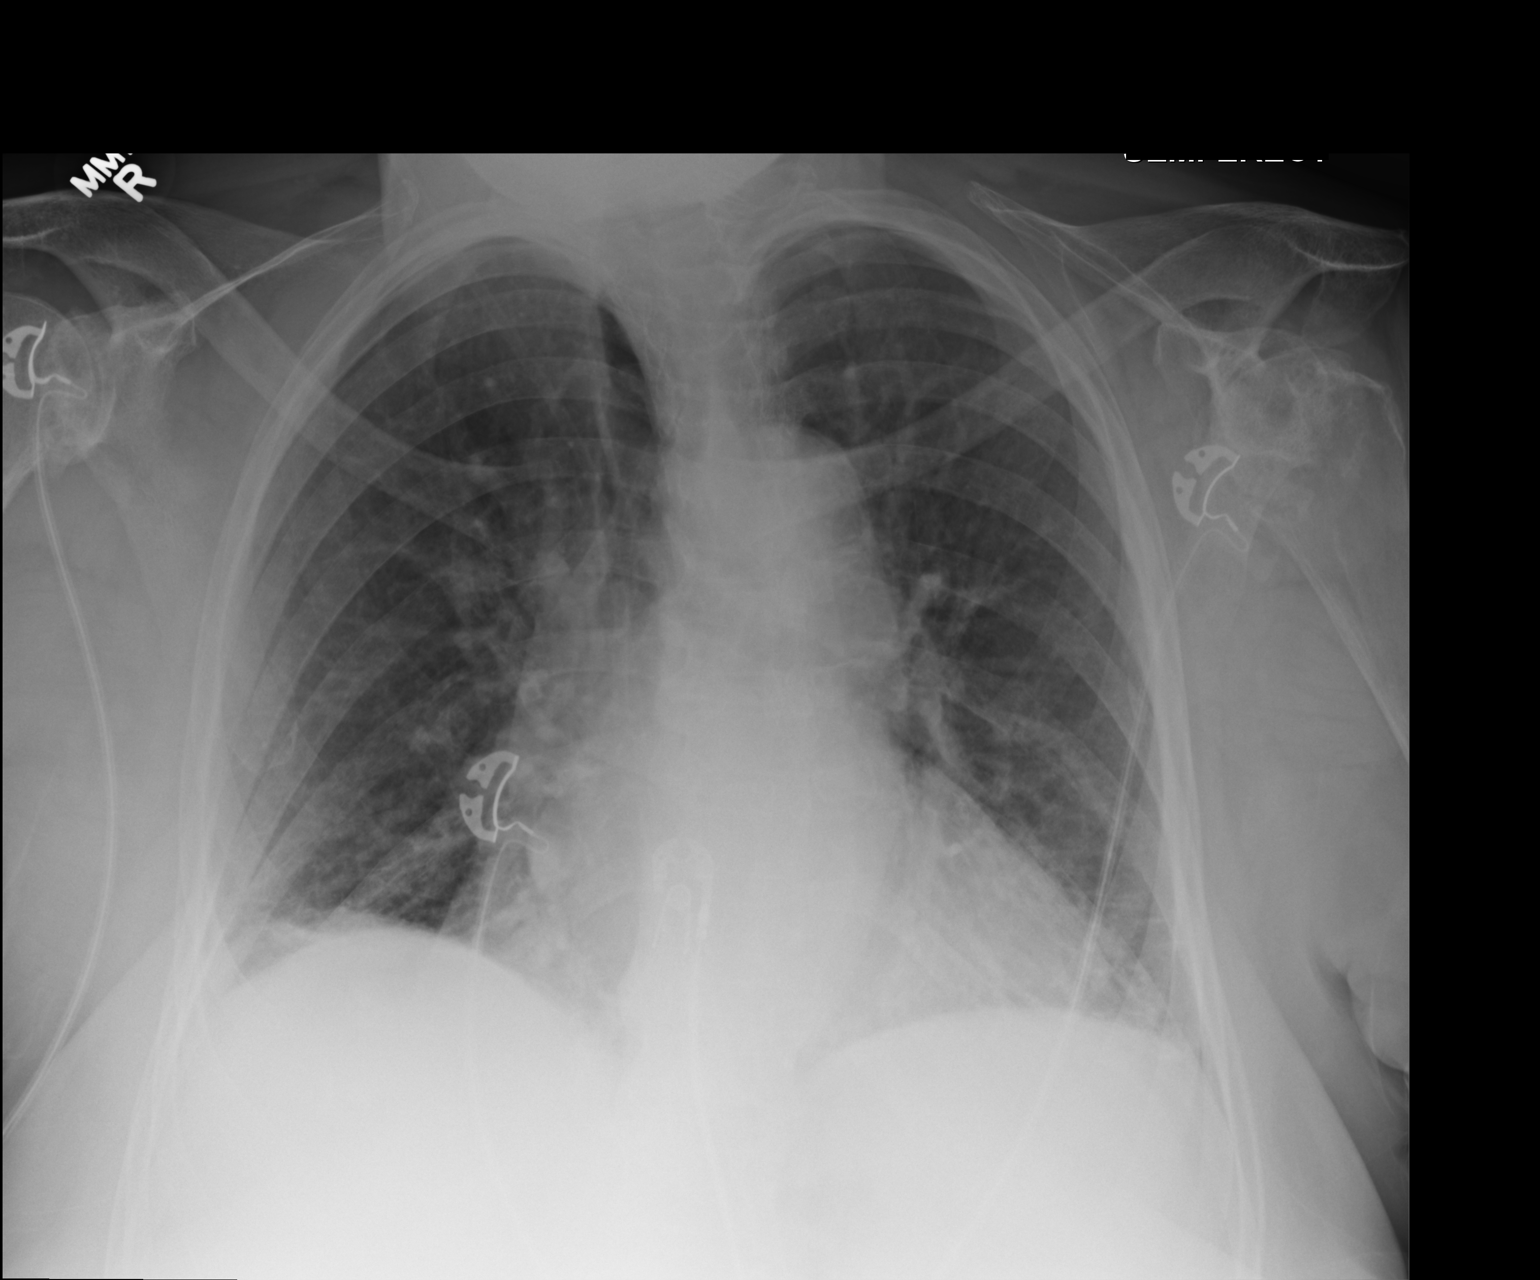

[1 of 1 positions shown; findings below may reference images not displayed]

FINDINGS: The lungs are adequately inflated. The interstitial markings are
coarse. The cardiac silhouette is enlarged. The pulmonary
vascularity is mildly prominent centrally. There is no pleural
effusion. There is tortuosity of the descending thoracic aorta.
There degenerative changes of the shoulders.
IMPRESSION: There is mild cardiomegaly with possible low-grade compensated CHF.
If the patient can tolerate the procedure, a PA and lateral chest
x-ray would be of value.

## 2016-06-27 ENCOUNTER — Ambulatory Visit (INDEPENDENT_AMBULATORY_CARE_PROVIDER_SITE_OTHER): Payer: MEDICARE | Admitting: Ophthalmology

## 2016-06-27 DIAGNOSIS — H35033 Hypertensive retinopathy, bilateral: Secondary | ICD-10-CM

## 2016-06-27 DIAGNOSIS — E113392 Type 2 diabetes mellitus with moderate nonproliferative diabetic retinopathy without macular edema, left eye: Secondary | ICD-10-CM

## 2016-06-27 DIAGNOSIS — E113311 Type 2 diabetes mellitus with moderate nonproliferative diabetic retinopathy with macular edema, right eye: Secondary | ICD-10-CM | POA: Diagnosis not present

## 2016-06-27 DIAGNOSIS — H43813 Vitreous degeneration, bilateral: Secondary | ICD-10-CM

## 2016-06-27 DIAGNOSIS — E11311 Type 2 diabetes mellitus with unspecified diabetic retinopathy with macular edema: Secondary | ICD-10-CM

## 2016-06-27 DIAGNOSIS — I10 Essential (primary) hypertension: Secondary | ICD-10-CM

## 2016-07-31 DIAGNOSIS — I119 Hypertensive heart disease without heart failure: Secondary | ICD-10-CM | POA: Diagnosis not present

## 2016-07-31 DIAGNOSIS — R32 Unspecified urinary incontinence: Secondary | ICD-10-CM | POA: Diagnosis not present

## 2016-07-31 DIAGNOSIS — E784 Other hyperlipidemia: Secondary | ICD-10-CM | POA: Diagnosis not present

## 2016-07-31 DIAGNOSIS — E119 Type 2 diabetes mellitus without complications: Secondary | ICD-10-CM | POA: Diagnosis not present

## 2016-08-30 DIAGNOSIS — E1151 Type 2 diabetes mellitus with diabetic peripheral angiopathy without gangrene: Secondary | ICD-10-CM | POA: Diagnosis not present

## 2016-08-30 DIAGNOSIS — E784 Other hyperlipidemia: Secondary | ICD-10-CM | POA: Diagnosis not present

## 2016-08-30 DIAGNOSIS — I10 Essential (primary) hypertension: Secondary | ICD-10-CM | POA: Diagnosis not present

## 2016-09-06 DIAGNOSIS — I7389 Other specified peripheral vascular diseases: Secondary | ICD-10-CM | POA: Diagnosis not present

## 2016-09-06 DIAGNOSIS — H353230 Exudative age-related macular degeneration, bilateral, stage unspecified: Secondary | ICD-10-CM | POA: Diagnosis not present

## 2016-09-06 DIAGNOSIS — E784 Other hyperlipidemia: Secondary | ICD-10-CM | POA: Diagnosis not present

## 2016-09-06 DIAGNOSIS — Z6833 Body mass index (BMI) 33.0-33.9, adult: Secondary | ICD-10-CM | POA: Diagnosis not present

## 2016-09-06 DIAGNOSIS — I10 Essential (primary) hypertension: Secondary | ICD-10-CM | POA: Diagnosis not present

## 2016-09-06 DIAGNOSIS — G308 Other Alzheimer's disease: Secondary | ICD-10-CM | POA: Diagnosis not present

## 2016-09-06 DIAGNOSIS — E1151 Type 2 diabetes mellitus with diabetic peripheral angiopathy without gangrene: Secondary | ICD-10-CM | POA: Diagnosis not present

## 2016-09-06 DIAGNOSIS — M1711 Unilateral primary osteoarthritis, right knee: Secondary | ICD-10-CM | POA: Diagnosis not present

## 2016-09-06 DIAGNOSIS — Z0000 Encounter for general adult medical examination without abnormal findings: Secondary | ICD-10-CM | POA: Diagnosis not present

## 2016-09-06 DIAGNOSIS — Z23 Encounter for immunization: Secondary | ICD-10-CM | POA: Diagnosis not present

## 2016-09-17 DIAGNOSIS — M8580 Other specified disorders of bone density and structure, unspecified site: Secondary | ICD-10-CM | POA: Diagnosis not present

## 2016-09-17 DIAGNOSIS — M150 Primary generalized (osteo)arthritis: Secondary | ICD-10-CM | POA: Diagnosis not present

## 2016-09-17 DIAGNOSIS — M0589 Other rheumatoid arthritis with rheumatoid factor of multiple sites: Secondary | ICD-10-CM | POA: Diagnosis not present

## 2016-09-17 DIAGNOSIS — M069 Rheumatoid arthritis, unspecified: Secondary | ICD-10-CM | POA: Diagnosis not present

## 2016-09-19 ENCOUNTER — Other Ambulatory Visit: Payer: Self-pay | Admitting: Internal Medicine

## 2016-09-19 DIAGNOSIS — Z1231 Encounter for screening mammogram for malignant neoplasm of breast: Secondary | ICD-10-CM

## 2016-09-29 DIAGNOSIS — E119 Type 2 diabetes mellitus without complications: Secondary | ICD-10-CM | POA: Diagnosis not present

## 2016-09-29 DIAGNOSIS — E784 Other hyperlipidemia: Secondary | ICD-10-CM | POA: Diagnosis not present

## 2016-09-29 DIAGNOSIS — I119 Hypertensive heart disease without heart failure: Secondary | ICD-10-CM | POA: Diagnosis not present

## 2016-09-29 DIAGNOSIS — R32 Unspecified urinary incontinence: Secondary | ICD-10-CM | POA: Diagnosis not present

## 2016-11-07 ENCOUNTER — Ambulatory Visit
Admission: RE | Admit: 2016-11-07 | Discharge: 2016-11-07 | Disposition: A | Discharge status: Home / Self Care | Payer: MEDICARE | Source: Ambulatory Visit | Attending: Internal Medicine | Admitting: Internal Medicine

## 2016-11-07 DIAGNOSIS — Z1231 Encounter for screening mammogram for malignant neoplasm of breast: Secondary | ICD-10-CM | POA: Diagnosis not present

## 2016-11-28 DIAGNOSIS — E784 Other hyperlipidemia: Secondary | ICD-10-CM | POA: Diagnosis not present

## 2016-11-28 DIAGNOSIS — I119 Hypertensive heart disease without heart failure: Secondary | ICD-10-CM | POA: Diagnosis not present

## 2016-11-28 DIAGNOSIS — R32 Unspecified urinary incontinence: Secondary | ICD-10-CM | POA: Diagnosis not present

## 2016-11-28 DIAGNOSIS — E119 Type 2 diabetes mellitus without complications: Secondary | ICD-10-CM | POA: Diagnosis not present

## 2016-12-18 DIAGNOSIS — M150 Primary generalized (osteo)arthritis: Secondary | ICD-10-CM | POA: Diagnosis not present

## 2016-12-18 DIAGNOSIS — M8580 Other specified disorders of bone density and structure, unspecified site: Secondary | ICD-10-CM | POA: Diagnosis not present

## 2016-12-18 DIAGNOSIS — M069 Rheumatoid arthritis, unspecified: Secondary | ICD-10-CM | POA: Diagnosis not present

## 2016-12-18 DIAGNOSIS — M0589 Other rheumatoid arthritis with rheumatoid factor of multiple sites: Secondary | ICD-10-CM | POA: Diagnosis not present

## 2016-12-31 ENCOUNTER — Ambulatory Visit (INDEPENDENT_AMBULATORY_CARE_PROVIDER_SITE_OTHER): Payer: MEDICARE | Admitting: Ophthalmology

## 2016-12-31 DIAGNOSIS — E11319 Type 2 diabetes mellitus with unspecified diabetic retinopathy without macular edema: Secondary | ICD-10-CM

## 2016-12-31 DIAGNOSIS — H43813 Vitreous degeneration, bilateral: Secondary | ICD-10-CM

## 2016-12-31 DIAGNOSIS — H35033 Hypertensive retinopathy, bilateral: Secondary | ICD-10-CM | POA: Diagnosis not present

## 2016-12-31 DIAGNOSIS — E113393 Type 2 diabetes mellitus with moderate nonproliferative diabetic retinopathy without macular edema, bilateral: Secondary | ICD-10-CM

## 2016-12-31 DIAGNOSIS — H35371 Puckering of macula, right eye: Secondary | ICD-10-CM

## 2016-12-31 DIAGNOSIS — I10 Essential (primary) hypertension: Secondary | ICD-10-CM

## 2017-01-07 DIAGNOSIS — M069 Rheumatoid arthritis, unspecified: Secondary | ICD-10-CM | POA: Diagnosis not present

## 2017-01-07 DIAGNOSIS — I10 Essential (primary) hypertension: Secondary | ICD-10-CM | POA: Diagnosis not present

## 2017-01-07 DIAGNOSIS — I7389 Other specified peripheral vascular diseases: Secondary | ICD-10-CM | POA: Diagnosis not present

## 2017-01-07 DIAGNOSIS — G308 Other Alzheimer's disease: Secondary | ICD-10-CM | POA: Diagnosis not present

## 2017-01-07 DIAGNOSIS — E1151 Type 2 diabetes mellitus with diabetic peripheral angiopathy without gangrene: Secondary | ICD-10-CM | POA: Diagnosis not present

## 2017-01-07 DIAGNOSIS — Z79899 Other long term (current) drug therapy: Secondary | ICD-10-CM | POA: Diagnosis not present

## 2017-01-07 DIAGNOSIS — H353230 Exudative age-related macular degeneration, bilateral, stage unspecified: Secondary | ICD-10-CM | POA: Diagnosis not present

## 2017-01-07 DIAGNOSIS — Z6833 Body mass index (BMI) 33.0-33.9, adult: Secondary | ICD-10-CM | POA: Diagnosis not present

## 2017-01-27 DIAGNOSIS — E784 Other hyperlipidemia: Secondary | ICD-10-CM | POA: Diagnosis not present

## 2017-01-27 DIAGNOSIS — I119 Hypertensive heart disease without heart failure: Secondary | ICD-10-CM | POA: Diagnosis not present

## 2017-01-27 DIAGNOSIS — E119 Type 2 diabetes mellitus without complications: Secondary | ICD-10-CM | POA: Diagnosis not present

## 2017-01-27 DIAGNOSIS — R32 Unspecified urinary incontinence: Secondary | ICD-10-CM | POA: Diagnosis not present

## 2017-03-19 DIAGNOSIS — M150 Primary generalized (osteo)arthritis: Secondary | ICD-10-CM | POA: Diagnosis not present

## 2017-03-19 DIAGNOSIS — M069 Rheumatoid arthritis, unspecified: Secondary | ICD-10-CM | POA: Diagnosis not present

## 2017-03-19 DIAGNOSIS — M0589 Other rheumatoid arthritis with rheumatoid factor of multiple sites: Secondary | ICD-10-CM | POA: Diagnosis not present

## 2017-03-19 DIAGNOSIS — M8580 Other specified disorders of bone density and structure, unspecified site: Secondary | ICD-10-CM | POA: Diagnosis not present

## 2017-03-28 DIAGNOSIS — E785 Hyperlipidemia, unspecified: Secondary | ICD-10-CM | POA: Diagnosis not present

## 2017-03-28 DIAGNOSIS — R32 Unspecified urinary incontinence: Secondary | ICD-10-CM | POA: Diagnosis not present

## 2017-03-28 DIAGNOSIS — I119 Hypertensive heart disease without heart failure: Secondary | ICD-10-CM | POA: Diagnosis not present

## 2017-03-28 DIAGNOSIS — E119 Type 2 diabetes mellitus without complications: Secondary | ICD-10-CM | POA: Diagnosis not present

## 2017-05-27 DIAGNOSIS — E119 Type 2 diabetes mellitus without complications: Secondary | ICD-10-CM | POA: Diagnosis not present

## 2017-05-27 DIAGNOSIS — R32 Unspecified urinary incontinence: Secondary | ICD-10-CM | POA: Diagnosis not present

## 2017-05-27 DIAGNOSIS — E784 Other hyperlipidemia: Secondary | ICD-10-CM | POA: Diagnosis not present

## 2017-05-27 DIAGNOSIS — I119 Hypertensive heart disease without heart failure: Secondary | ICD-10-CM | POA: Diagnosis not present

## 2017-06-05 DIAGNOSIS — E119 Type 2 diabetes mellitus without complications: Secondary | ICD-10-CM | POA: Diagnosis not present

## 2017-06-05 DIAGNOSIS — H35373 Puckering of macula, bilateral: Secondary | ICD-10-CM | POA: Diagnosis not present

## 2017-06-05 DIAGNOSIS — H04123 Dry eye syndrome of bilateral lacrimal glands: Secondary | ICD-10-CM | POA: Diagnosis not present

## 2017-07-15 ENCOUNTER — Ambulatory Visit (INDEPENDENT_AMBULATORY_CARE_PROVIDER_SITE_OTHER): Payer: MEDICARE | Admitting: Ophthalmology

## 2017-07-15 DIAGNOSIS — E113391 Type 2 diabetes mellitus with moderate nonproliferative diabetic retinopathy without macular edema, right eye: Secondary | ICD-10-CM | POA: Diagnosis not present

## 2017-07-15 DIAGNOSIS — I10 Essential (primary) hypertension: Secondary | ICD-10-CM | POA: Diagnosis not present

## 2017-07-15 DIAGNOSIS — H43813 Vitreous degeneration, bilateral: Secondary | ICD-10-CM | POA: Diagnosis not present

## 2017-07-15 DIAGNOSIS — E113292 Type 2 diabetes mellitus with mild nonproliferative diabetic retinopathy without macular edema, left eye: Secondary | ICD-10-CM | POA: Diagnosis not present

## 2017-07-15 DIAGNOSIS — E11319 Type 2 diabetes mellitus with unspecified diabetic retinopathy without macular edema: Secondary | ICD-10-CM | POA: Diagnosis not present

## 2017-07-15 DIAGNOSIS — H35033 Hypertensive retinopathy, bilateral: Secondary | ICD-10-CM | POA: Diagnosis not present

## 2017-07-26 DIAGNOSIS — E784 Other hyperlipidemia: Secondary | ICD-10-CM | POA: Diagnosis not present

## 2017-07-26 DIAGNOSIS — R32 Unspecified urinary incontinence: Secondary | ICD-10-CM | POA: Diagnosis not present

## 2017-07-26 DIAGNOSIS — E119 Type 2 diabetes mellitus without complications: Secondary | ICD-10-CM | POA: Diagnosis not present

## 2017-07-26 DIAGNOSIS — I119 Hypertensive heart disease without heart failure: Secondary | ICD-10-CM | POA: Diagnosis not present

## 2017-08-04 DIAGNOSIS — M8580 Other specified disorders of bone density and structure, unspecified site: Secondary | ICD-10-CM | POA: Diagnosis not present

## 2017-08-04 DIAGNOSIS — Z79899 Other long term (current) drug therapy: Secondary | ICD-10-CM | POA: Diagnosis not present

## 2017-08-04 DIAGNOSIS — M0589 Other rheumatoid arthritis with rheumatoid factor of multiple sites: Secondary | ICD-10-CM | POA: Diagnosis not present

## 2017-08-04 DIAGNOSIS — M150 Primary generalized (osteo)arthritis: Secondary | ICD-10-CM | POA: Diagnosis not present

## 2017-09-01 DIAGNOSIS — Z6833 Body mass index (BMI) 33.0-33.9, adult: Secondary | ICD-10-CM | POA: Diagnosis not present

## 2017-09-01 DIAGNOSIS — E1151 Type 2 diabetes mellitus with diabetic peripheral angiopathy without gangrene: Secondary | ICD-10-CM | POA: Diagnosis not present

## 2017-09-01 DIAGNOSIS — R05 Cough: Secondary | ICD-10-CM | POA: Diagnosis not present

## 2017-09-01 DIAGNOSIS — J069 Acute upper respiratory infection, unspecified: Secondary | ICD-10-CM | POA: Diagnosis not present

## 2017-09-01 DIAGNOSIS — I10 Essential (primary) hypertension: Secondary | ICD-10-CM | POA: Diagnosis not present

## 2017-12-15 ENCOUNTER — Other Ambulatory Visit: Payer: Self-pay | Admitting: Internal Medicine

## 2017-12-15 DIAGNOSIS — Z1231 Encounter for screening mammogram for malignant neoplasm of breast: Secondary | ICD-10-CM

## 2018-01-06 ENCOUNTER — Ambulatory Visit
Admission: RE | Admit: 2018-01-06 | Discharge: 2018-01-06 | Disposition: A | Discharge status: Home / Self Care | Payer: MEDICARE | Source: Ambulatory Visit | Attending: Internal Medicine | Admitting: Internal Medicine

## 2018-01-06 DIAGNOSIS — Z1231 Encounter for screening mammogram for malignant neoplasm of breast: Secondary | ICD-10-CM

## 2018-01-09 DIAGNOSIS — R05 Cough: Secondary | ICD-10-CM | POA: Diagnosis not present

## 2018-01-09 DIAGNOSIS — Z6833 Body mass index (BMI) 33.0-33.9, adult: Secondary | ICD-10-CM | POA: Diagnosis not present

## 2018-01-09 DIAGNOSIS — J209 Acute bronchitis, unspecified: Secondary | ICD-10-CM | POA: Diagnosis not present

## 2018-01-22 ENCOUNTER — Encounter (INDEPENDENT_AMBULATORY_CARE_PROVIDER_SITE_OTHER): Payer: MEDICARE | Admitting: Ophthalmology

## 2018-01-22 DIAGNOSIS — H35033 Hypertensive retinopathy, bilateral: Secondary | ICD-10-CM

## 2018-01-22 DIAGNOSIS — E11319 Type 2 diabetes mellitus with unspecified diabetic retinopathy without macular edema: Secondary | ICD-10-CM

## 2018-01-22 DIAGNOSIS — H43813 Vitreous degeneration, bilateral: Secondary | ICD-10-CM | POA: Diagnosis not present

## 2018-01-22 DIAGNOSIS — E113391 Type 2 diabetes mellitus with moderate nonproliferative diabetic retinopathy without macular edema, right eye: Secondary | ICD-10-CM

## 2018-01-22 DIAGNOSIS — I10 Essential (primary) hypertension: Secondary | ICD-10-CM

## 2018-01-22 DIAGNOSIS — E113292 Type 2 diabetes mellitus with mild nonproliferative diabetic retinopathy without macular edema, left eye: Secondary | ICD-10-CM

## 2018-02-02 DIAGNOSIS — M8580 Other specified disorders of bone density and structure, unspecified site: Secondary | ICD-10-CM | POA: Diagnosis not present

## 2018-02-02 DIAGNOSIS — M150 Primary generalized (osteo)arthritis: Secondary | ICD-10-CM | POA: Diagnosis not present

## 2018-02-02 DIAGNOSIS — Z79899 Other long term (current) drug therapy: Secondary | ICD-10-CM | POA: Diagnosis not present

## 2018-02-02 DIAGNOSIS — M0589 Other rheumatoid arthritis with rheumatoid factor of multiple sites: Secondary | ICD-10-CM | POA: Diagnosis not present

## 2018-05-22 DIAGNOSIS — E7849 Other hyperlipidemia: Secondary | ICD-10-CM | POA: Diagnosis not present

## 2018-05-22 DIAGNOSIS — R32 Unspecified urinary incontinence: Secondary | ICD-10-CM | POA: Diagnosis not present

## 2018-05-22 DIAGNOSIS — I119 Hypertensive heart disease without heart failure: Secondary | ICD-10-CM | POA: Diagnosis not present

## 2018-05-22 DIAGNOSIS — E119 Type 2 diabetes mellitus without complications: Secondary | ICD-10-CM | POA: Diagnosis not present

## 2018-06-02 DIAGNOSIS — M8580 Other specified disorders of bone density and structure, unspecified site: Secondary | ICD-10-CM | POA: Diagnosis not present

## 2018-06-02 DIAGNOSIS — E119 Type 2 diabetes mellitus without complications: Secondary | ICD-10-CM | POA: Diagnosis not present

## 2018-06-02 DIAGNOSIS — M0589 Other rheumatoid arthritis with rheumatoid factor of multiple sites: Secondary | ICD-10-CM | POA: Diagnosis not present

## 2018-06-02 DIAGNOSIS — M069 Rheumatoid arthritis, unspecified: Secondary | ICD-10-CM | POA: Diagnosis not present

## 2018-06-02 DIAGNOSIS — M150 Primary generalized (osteo)arthritis: Secondary | ICD-10-CM | POA: Diagnosis not present

## 2018-06-02 DIAGNOSIS — Z79899 Other long term (current) drug therapy: Secondary | ICD-10-CM | POA: Diagnosis not present

## 2018-07-23 ENCOUNTER — Encounter (INDEPENDENT_AMBULATORY_CARE_PROVIDER_SITE_OTHER): Payer: MEDICARE | Admitting: Ophthalmology

## 2018-07-23 DIAGNOSIS — H43813 Vitreous degeneration, bilateral: Secondary | ICD-10-CM | POA: Diagnosis not present

## 2018-07-23 DIAGNOSIS — H35033 Hypertensive retinopathy, bilateral: Secondary | ICD-10-CM

## 2018-07-23 DIAGNOSIS — E11319 Type 2 diabetes mellitus with unspecified diabetic retinopathy without macular edema: Secondary | ICD-10-CM | POA: Diagnosis not present

## 2018-07-23 DIAGNOSIS — E113391 Type 2 diabetes mellitus with moderate nonproliferative diabetic retinopathy without macular edema, right eye: Secondary | ICD-10-CM

## 2018-07-23 DIAGNOSIS — I10 Essential (primary) hypertension: Secondary | ICD-10-CM | POA: Diagnosis not present

## 2018-07-23 DIAGNOSIS — E113292 Type 2 diabetes mellitus with mild nonproliferative diabetic retinopathy without macular edema, left eye: Secondary | ICD-10-CM | POA: Diagnosis not present

## 2018-09-10 DIAGNOSIS — E119 Type 2 diabetes mellitus without complications: Secondary | ICD-10-CM | POA: Diagnosis not present

## 2018-09-10 DIAGNOSIS — M0589 Other rheumatoid arthritis with rheumatoid factor of multiple sites: Secondary | ICD-10-CM | POA: Diagnosis not present

## 2018-09-10 DIAGNOSIS — M150 Primary generalized (osteo)arthritis: Secondary | ICD-10-CM | POA: Diagnosis not present

## 2018-09-10 DIAGNOSIS — Z79899 Other long term (current) drug therapy: Secondary | ICD-10-CM | POA: Diagnosis not present

## 2018-09-10 DIAGNOSIS — M8580 Other specified disorders of bone density and structure, unspecified site: Secondary | ICD-10-CM | POA: Diagnosis not present

## 2018-10-06 DIAGNOSIS — Z23 Encounter for immunization: Secondary | ICD-10-CM | POA: Diagnosis not present

## 2018-12-24 ENCOUNTER — Other Ambulatory Visit: Payer: Self-pay | Admitting: Internal Medicine

## 2018-12-24 DIAGNOSIS — Z1231 Encounter for screening mammogram for malignant neoplasm of breast: Secondary | ICD-10-CM

## 2019-01-17 DIAGNOSIS — R32 Unspecified urinary incontinence: Secondary | ICD-10-CM | POA: Diagnosis not present

## 2019-01-17 DIAGNOSIS — E785 Hyperlipidemia, unspecified: Secondary | ICD-10-CM | POA: Diagnosis not present

## 2019-01-17 DIAGNOSIS — I119 Hypertensive heart disease without heart failure: Secondary | ICD-10-CM | POA: Diagnosis not present

## 2019-01-17 DIAGNOSIS — E119 Type 2 diabetes mellitus without complications: Secondary | ICD-10-CM | POA: Diagnosis not present

## 2019-01-27 ENCOUNTER — Ambulatory Visit
Admission: RE | Admit: 2019-01-27 | Discharge: 2019-01-27 | Disposition: A | Discharge status: Home / Self Care | Payer: MEDICARE | Source: Ambulatory Visit | Attending: Internal Medicine | Admitting: Internal Medicine

## 2019-01-27 DIAGNOSIS — Z1231 Encounter for screening mammogram for malignant neoplasm of breast: Secondary | ICD-10-CM

## 2019-02-06 DIAGNOSIS — E119 Type 2 diabetes mellitus without complications: Secondary | ICD-10-CM | POA: Diagnosis not present

## 2019-02-06 DIAGNOSIS — E7849 Other hyperlipidemia: Secondary | ICD-10-CM | POA: Diagnosis not present

## 2019-02-06 DIAGNOSIS — R32 Unspecified urinary incontinence: Secondary | ICD-10-CM | POA: Diagnosis not present

## 2019-02-06 DIAGNOSIS — I119 Hypertensive heart disease without heart failure: Secondary | ICD-10-CM | POA: Diagnosis not present

## 2019-02-10 DIAGNOSIS — E7849 Other hyperlipidemia: Secondary | ICD-10-CM | POA: Diagnosis not present

## 2019-02-10 DIAGNOSIS — I10 Essential (primary) hypertension: Secondary | ICD-10-CM | POA: Diagnosis not present

## 2019-02-10 DIAGNOSIS — E1151 Type 2 diabetes mellitus with diabetic peripheral angiopathy without gangrene: Secondary | ICD-10-CM | POA: Diagnosis not present

## 2019-02-11 ENCOUNTER — Encounter (INDEPENDENT_AMBULATORY_CARE_PROVIDER_SITE_OTHER): Payer: MEDICARE | Admitting: Ophthalmology

## 2019-02-11 DIAGNOSIS — H43813 Vitreous degeneration, bilateral: Secondary | ICD-10-CM | POA: Diagnosis not present

## 2019-02-11 DIAGNOSIS — E11319 Type 2 diabetes mellitus with unspecified diabetic retinopathy without macular edema: Secondary | ICD-10-CM | POA: Diagnosis not present

## 2019-02-11 DIAGNOSIS — H35373 Puckering of macula, bilateral: Secondary | ICD-10-CM | POA: Diagnosis not present

## 2019-02-11 DIAGNOSIS — H35033 Hypertensive retinopathy, bilateral: Secondary | ICD-10-CM | POA: Diagnosis not present

## 2019-02-11 DIAGNOSIS — I10 Essential (primary) hypertension: Secondary | ICD-10-CM | POA: Diagnosis not present

## 2019-02-11 DIAGNOSIS — E113393 Type 2 diabetes mellitus with moderate nonproliferative diabetic retinopathy without macular edema, bilateral: Secondary | ICD-10-CM | POA: Diagnosis not present

## 2019-02-18 DIAGNOSIS — Z0000 Encounter for general adult medical examination without abnormal findings: Secondary | ICD-10-CM | POA: Diagnosis not present

## 2019-02-18 DIAGNOSIS — H538 Other visual disturbances: Secondary | ICD-10-CM | POA: Diagnosis not present

## 2019-02-18 DIAGNOSIS — Z1331 Encounter for screening for depression: Secondary | ICD-10-CM | POA: Diagnosis not present

## 2019-02-18 DIAGNOSIS — Z6830 Body mass index (BMI) 30.0-30.9, adult: Secondary | ICD-10-CM | POA: Diagnosis not present

## 2019-02-18 DIAGNOSIS — I10 Essential (primary) hypertension: Secondary | ICD-10-CM | POA: Diagnosis not present

## 2019-02-18 DIAGNOSIS — E7849 Other hyperlipidemia: Secondary | ICD-10-CM | POA: Diagnosis not present

## 2019-02-18 DIAGNOSIS — E1151 Type 2 diabetes mellitus with diabetic peripheral angiopathy without gangrene: Secondary | ICD-10-CM | POA: Diagnosis not present

## 2019-02-18 DIAGNOSIS — I7389 Other specified peripheral vascular diseases: Secondary | ICD-10-CM | POA: Diagnosis not present

## 2019-02-18 DIAGNOSIS — M069 Rheumatoid arthritis, unspecified: Secondary | ICD-10-CM | POA: Diagnosis not present

## 2019-02-18 DIAGNOSIS — G309 Alzheimer's disease, unspecified: Secondary | ICD-10-CM | POA: Diagnosis not present

## 2019-03-18 DIAGNOSIS — E785 Hyperlipidemia, unspecified: Secondary | ICD-10-CM | POA: Diagnosis not present

## 2019-03-18 DIAGNOSIS — R32 Unspecified urinary incontinence: Secondary | ICD-10-CM | POA: Diagnosis not present

## 2019-03-18 DIAGNOSIS — I119 Hypertensive heart disease without heart failure: Secondary | ICD-10-CM | POA: Diagnosis not present

## 2019-03-18 DIAGNOSIS — E119 Type 2 diabetes mellitus without complications: Secondary | ICD-10-CM | POA: Diagnosis not present

## 2019-05-17 DIAGNOSIS — R32 Unspecified urinary incontinence: Secondary | ICD-10-CM | POA: Diagnosis not present

## 2019-05-17 DIAGNOSIS — E785 Hyperlipidemia, unspecified: Secondary | ICD-10-CM | POA: Diagnosis not present

## 2019-05-17 DIAGNOSIS — E119 Type 2 diabetes mellitus without complications: Secondary | ICD-10-CM | POA: Diagnosis not present

## 2019-05-17 DIAGNOSIS — I119 Hypertensive heart disease without heart failure: Secondary | ICD-10-CM | POA: Diagnosis not present

## 2019-05-19 DIAGNOSIS — M0589 Other rheumatoid arthritis with rheumatoid factor of multiple sites: Secondary | ICD-10-CM | POA: Diagnosis not present

## 2019-05-19 DIAGNOSIS — M8580 Other specified disorders of bone density and structure, unspecified site: Secondary | ICD-10-CM | POA: Diagnosis not present

## 2019-05-19 DIAGNOSIS — M150 Primary generalized (osteo)arthritis: Secondary | ICD-10-CM | POA: Diagnosis not present

## 2019-05-19 DIAGNOSIS — Z79899 Other long term (current) drug therapy: Secondary | ICD-10-CM | POA: Diagnosis not present

## 2019-05-19 DIAGNOSIS — E119 Type 2 diabetes mellitus without complications: Secondary | ICD-10-CM | POA: Diagnosis not present

## 2019-07-16 DIAGNOSIS — I119 Hypertensive heart disease without heart failure: Secondary | ICD-10-CM | POA: Diagnosis not present

## 2019-07-16 DIAGNOSIS — E785 Hyperlipidemia, unspecified: Secondary | ICD-10-CM | POA: Diagnosis not present

## 2019-07-16 DIAGNOSIS — R32 Unspecified urinary incontinence: Secondary | ICD-10-CM | POA: Diagnosis not present

## 2019-07-16 DIAGNOSIS — E119 Type 2 diabetes mellitus without complications: Secondary | ICD-10-CM | POA: Diagnosis not present

## 2019-07-29 DIAGNOSIS — H3530 Unspecified macular degeneration: Secondary | ICD-10-CM | POA: Diagnosis not present

## 2019-07-29 DIAGNOSIS — I10 Essential (primary) hypertension: Secondary | ICD-10-CM | POA: Diagnosis not present

## 2019-07-29 DIAGNOSIS — E1151 Type 2 diabetes mellitus with diabetic peripheral angiopathy without gangrene: Secondary | ICD-10-CM | POA: Diagnosis not present

## 2019-07-29 DIAGNOSIS — E785 Hyperlipidemia, unspecified: Secondary | ICD-10-CM | POA: Diagnosis not present

## 2019-07-29 DIAGNOSIS — M069 Rheumatoid arthritis, unspecified: Secondary | ICD-10-CM | POA: Diagnosis not present

## 2019-07-29 DIAGNOSIS — G309 Alzheimer's disease, unspecified: Secondary | ICD-10-CM | POA: Diagnosis not present

## 2019-07-29 DIAGNOSIS — I739 Peripheral vascular disease, unspecified: Secondary | ICD-10-CM | POA: Diagnosis not present

## 2019-08-05 DIAGNOSIS — Z20828 Contact with and (suspected) exposure to other viral communicable diseases: Secondary | ICD-10-CM | POA: Diagnosis not present

## 2019-08-05 DIAGNOSIS — E1151 Type 2 diabetes mellitus with diabetic peripheral angiopathy without gangrene: Secondary | ICD-10-CM | POA: Diagnosis not present

## 2019-08-05 DIAGNOSIS — Z23 Encounter for immunization: Secondary | ICD-10-CM | POA: Diagnosis not present

## 2019-08-18 DIAGNOSIS — M150 Primary generalized (osteo)arthritis: Secondary | ICD-10-CM | POA: Diagnosis not present

## 2019-08-18 DIAGNOSIS — Z79899 Other long term (current) drug therapy: Secondary | ICD-10-CM | POA: Diagnosis not present

## 2019-08-18 DIAGNOSIS — E119 Type 2 diabetes mellitus without complications: Secondary | ICD-10-CM | POA: Diagnosis not present

## 2019-08-18 DIAGNOSIS — M8580 Other specified disorders of bone density and structure, unspecified site: Secondary | ICD-10-CM | POA: Diagnosis not present

## 2019-08-18 DIAGNOSIS — M0589 Other rheumatoid arthritis with rheumatoid factor of multiple sites: Secondary | ICD-10-CM | POA: Diagnosis not present

## 2019-08-19 ENCOUNTER — Encounter (INDEPENDENT_AMBULATORY_CARE_PROVIDER_SITE_OTHER): Payer: MEDICARE | Admitting: Ophthalmology

## 2019-08-20 ENCOUNTER — Other Ambulatory Visit: Payer: Self-pay

## 2019-08-20 ENCOUNTER — Encounter (INDEPENDENT_AMBULATORY_CARE_PROVIDER_SITE_OTHER): Payer: MEDICARE | Admitting: Ophthalmology

## 2019-08-20 DIAGNOSIS — I10 Essential (primary) hypertension: Secondary | ICD-10-CM

## 2019-08-20 DIAGNOSIS — H35033 Hypertensive retinopathy, bilateral: Secondary | ICD-10-CM

## 2019-08-20 DIAGNOSIS — E113292 Type 2 diabetes mellitus with mild nonproliferative diabetic retinopathy without macular edema, left eye: Secondary | ICD-10-CM

## 2019-08-20 DIAGNOSIS — E113391 Type 2 diabetes mellitus with moderate nonproliferative diabetic retinopathy without macular edema, right eye: Secondary | ICD-10-CM | POA: Diagnosis not present

## 2019-08-20 DIAGNOSIS — H43813 Vitreous degeneration, bilateral: Secondary | ICD-10-CM

## 2019-08-20 DIAGNOSIS — E11319 Type 2 diabetes mellitus with unspecified diabetic retinopathy without macular edema: Secondary | ICD-10-CM | POA: Diagnosis not present

## 2019-09-14 DIAGNOSIS — E119 Type 2 diabetes mellitus without complications: Secondary | ICD-10-CM | POA: Diagnosis not present

## 2019-09-14 DIAGNOSIS — E785 Hyperlipidemia, unspecified: Secondary | ICD-10-CM | POA: Diagnosis not present

## 2019-09-14 DIAGNOSIS — I119 Hypertensive heart disease without heart failure: Secondary | ICD-10-CM | POA: Diagnosis not present

## 2019-09-14 DIAGNOSIS — R32 Unspecified urinary incontinence: Secondary | ICD-10-CM | POA: Diagnosis not present

## 2019-12-14 DIAGNOSIS — M0589 Other rheumatoid arthritis with rheumatoid factor of multiple sites: Secondary | ICD-10-CM | POA: Diagnosis not present

## 2019-12-21 ENCOUNTER — Other Ambulatory Visit: Payer: Self-pay | Admitting: Internal Medicine

## 2019-12-21 DIAGNOSIS — Z1231 Encounter for screening mammogram for malignant neoplasm of breast: Secondary | ICD-10-CM

## 2019-12-23 DIAGNOSIS — Z23 Encounter for immunization: Secondary | ICD-10-CM | POA: Diagnosis not present

## 2020-01-12 DIAGNOSIS — R32 Unspecified urinary incontinence: Secondary | ICD-10-CM | POA: Diagnosis not present

## 2020-01-12 DIAGNOSIS — E785 Hyperlipidemia, unspecified: Secondary | ICD-10-CM | POA: Diagnosis not present

## 2020-01-12 DIAGNOSIS — I119 Hypertensive heart disease without heart failure: Secondary | ICD-10-CM | POA: Diagnosis not present

## 2020-01-12 DIAGNOSIS — E119 Type 2 diabetes mellitus without complications: Secondary | ICD-10-CM | POA: Diagnosis not present

## 2020-01-20 DIAGNOSIS — Z23 Encounter for immunization: Secondary | ICD-10-CM | POA: Diagnosis not present

## 2020-02-01 ENCOUNTER — Other Ambulatory Visit: Payer: Self-pay

## 2020-02-01 ENCOUNTER — Ambulatory Visit
Admission: RE | Admit: 2020-02-01 | Discharge: 2020-02-01 | Disposition: A | Discharge status: Home / Self Care | Payer: MEDICARE | Source: Ambulatory Visit | Attending: Internal Medicine | Admitting: Internal Medicine

## 2020-02-01 DIAGNOSIS — Z1231 Encounter for screening mammogram for malignant neoplasm of breast: Secondary | ICD-10-CM | POA: Diagnosis not present

## 2020-02-21 ENCOUNTER — Encounter (INDEPENDENT_AMBULATORY_CARE_PROVIDER_SITE_OTHER): Payer: MEDICARE | Admitting: Ophthalmology

## 2020-02-21 ENCOUNTER — Other Ambulatory Visit: Payer: Self-pay

## 2020-02-21 DIAGNOSIS — H35033 Hypertensive retinopathy, bilateral: Secondary | ICD-10-CM

## 2020-02-21 DIAGNOSIS — H43813 Vitreous degeneration, bilateral: Secondary | ICD-10-CM | POA: Diagnosis not present

## 2020-02-21 DIAGNOSIS — E11319 Type 2 diabetes mellitus with unspecified diabetic retinopathy without macular edema: Secondary | ICD-10-CM

## 2020-02-21 DIAGNOSIS — I10 Essential (primary) hypertension: Secondary | ICD-10-CM | POA: Diagnosis not present

## 2020-02-21 DIAGNOSIS — E113393 Type 2 diabetes mellitus with moderate nonproliferative diabetic retinopathy without macular edema, bilateral: Secondary | ICD-10-CM | POA: Diagnosis not present

## 2020-02-24 DIAGNOSIS — E7849 Other hyperlipidemia: Secondary | ICD-10-CM | POA: Diagnosis not present

## 2020-02-24 DIAGNOSIS — E1151 Type 2 diabetes mellitus with diabetic peripheral angiopathy without gangrene: Secondary | ICD-10-CM | POA: Diagnosis not present

## 2020-03-02 DIAGNOSIS — R32 Unspecified urinary incontinence: Secondary | ICD-10-CM | POA: Diagnosis not present

## 2020-03-02 DIAGNOSIS — I10 Essential (primary) hypertension: Secondary | ICD-10-CM | POA: Diagnosis not present

## 2020-03-02 DIAGNOSIS — E1151 Type 2 diabetes mellitus with diabetic peripheral angiopathy without gangrene: Secondary | ICD-10-CM | POA: Diagnosis not present

## 2020-03-02 DIAGNOSIS — M069 Rheumatoid arthritis, unspecified: Secondary | ICD-10-CM | POA: Diagnosis not present

## 2020-03-02 DIAGNOSIS — Z794 Long term (current) use of insulin: Secondary | ICD-10-CM | POA: Diagnosis not present

## 2020-03-02 DIAGNOSIS — I739 Peripheral vascular disease, unspecified: Secondary | ICD-10-CM | POA: Diagnosis not present

## 2020-03-02 DIAGNOSIS — H3530 Unspecified macular degeneration: Secondary | ICD-10-CM | POA: Diagnosis not present

## 2020-03-02 DIAGNOSIS — Z0000 Encounter for general adult medical examination without abnormal findings: Secondary | ICD-10-CM | POA: Diagnosis not present

## 2020-03-02 DIAGNOSIS — M179 Osteoarthritis of knee, unspecified: Secondary | ICD-10-CM | POA: Diagnosis not present

## 2020-03-02 DIAGNOSIS — G309 Alzheimer's disease, unspecified: Secondary | ICD-10-CM | POA: Diagnosis not present

## 2020-03-02 DIAGNOSIS — E785 Hyperlipidemia, unspecified: Secondary | ICD-10-CM | POA: Diagnosis not present

## 2020-03-12 DIAGNOSIS — E785 Hyperlipidemia, unspecified: Secondary | ICD-10-CM | POA: Diagnosis not present

## 2020-03-12 DIAGNOSIS — I119 Hypertensive heart disease without heart failure: Secondary | ICD-10-CM | POA: Diagnosis not present

## 2020-03-12 DIAGNOSIS — E119 Type 2 diabetes mellitus without complications: Secondary | ICD-10-CM | POA: Diagnosis not present

## 2020-03-12 DIAGNOSIS — R32 Unspecified urinary incontinence: Secondary | ICD-10-CM | POA: Diagnosis not present

## 2020-03-15 DIAGNOSIS — Z79899 Other long term (current) drug therapy: Secondary | ICD-10-CM | POA: Diagnosis not present

## 2020-03-15 DIAGNOSIS — M0589 Other rheumatoid arthritis with rheumatoid factor of multiple sites: Secondary | ICD-10-CM | POA: Diagnosis not present

## 2020-03-15 DIAGNOSIS — M25569 Pain in unspecified knee: Secondary | ICD-10-CM | POA: Diagnosis not present

## 2020-03-15 DIAGNOSIS — M8580 Other specified disorders of bone density and structure, unspecified site: Secondary | ICD-10-CM | POA: Diagnosis not present

## 2020-03-15 DIAGNOSIS — E119 Type 2 diabetes mellitus without complications: Secondary | ICD-10-CM | POA: Diagnosis not present

## 2020-03-15 DIAGNOSIS — M150 Primary generalized (osteo)arthritis: Secondary | ICD-10-CM | POA: Diagnosis not present

## 2020-03-15 DIAGNOSIS — M25519 Pain in unspecified shoulder: Secondary | ICD-10-CM | POA: Diagnosis not present

## 2020-05-04 DIAGNOSIS — Z9189 Other specified personal risk factors, not elsewhere classified: Secondary | ICD-10-CM | POA: Diagnosis not present

## 2020-05-04 DIAGNOSIS — Z20828 Contact with and (suspected) exposure to other viral communicable diseases: Secondary | ICD-10-CM | POA: Diagnosis not present

## 2020-05-11 DIAGNOSIS — E785 Hyperlipidemia, unspecified: Secondary | ICD-10-CM | POA: Diagnosis not present

## 2020-05-11 DIAGNOSIS — I119 Hypertensive heart disease without heart failure: Secondary | ICD-10-CM | POA: Diagnosis not present

## 2020-05-11 DIAGNOSIS — R32 Unspecified urinary incontinence: Secondary | ICD-10-CM | POA: Diagnosis not present

## 2020-05-11 DIAGNOSIS — E119 Type 2 diabetes mellitus without complications: Secondary | ICD-10-CM | POA: Diagnosis not present

## 2020-07-10 DIAGNOSIS — R32 Unspecified urinary incontinence: Secondary | ICD-10-CM | POA: Diagnosis not present

## 2020-07-10 DIAGNOSIS — E785 Hyperlipidemia, unspecified: Secondary | ICD-10-CM | POA: Diagnosis not present

## 2020-07-10 DIAGNOSIS — I119 Hypertensive heart disease without heart failure: Secondary | ICD-10-CM | POA: Diagnosis not present

## 2020-07-10 DIAGNOSIS — E119 Type 2 diabetes mellitus without complications: Secondary | ICD-10-CM | POA: Diagnosis not present

## 2020-07-13 DIAGNOSIS — I10 Essential (primary) hypertension: Secondary | ICD-10-CM | POA: Diagnosis not present

## 2020-07-13 DIAGNOSIS — E785 Hyperlipidemia, unspecified: Secondary | ICD-10-CM | POA: Diagnosis not present

## 2020-07-13 DIAGNOSIS — M179 Osteoarthritis of knee, unspecified: Secondary | ICD-10-CM | POA: Diagnosis not present

## 2020-07-13 DIAGNOSIS — E1151 Type 2 diabetes mellitus with diabetic peripheral angiopathy without gangrene: Secondary | ICD-10-CM | POA: Diagnosis not present

## 2020-07-13 DIAGNOSIS — I739 Peripheral vascular disease, unspecified: Secondary | ICD-10-CM | POA: Diagnosis not present

## 2020-07-13 DIAGNOSIS — H903 Sensorineural hearing loss, bilateral: Secondary | ICD-10-CM | POA: Diagnosis not present

## 2020-07-13 DIAGNOSIS — Z794 Long term (current) use of insulin: Secondary | ICD-10-CM | POA: Diagnosis not present

## 2020-07-13 DIAGNOSIS — M069 Rheumatoid arthritis, unspecified: Secondary | ICD-10-CM | POA: Diagnosis not present

## 2020-07-13 DIAGNOSIS — G309 Alzheimer's disease, unspecified: Secondary | ICD-10-CM | POA: Diagnosis not present

## 2020-07-17 DIAGNOSIS — H04123 Dry eye syndrome of bilateral lacrimal glands: Secondary | ICD-10-CM | POA: Diagnosis not present

## 2020-07-17 DIAGNOSIS — Z961 Presence of intraocular lens: Secondary | ICD-10-CM | POA: Diagnosis not present

## 2020-07-17 DIAGNOSIS — E119 Type 2 diabetes mellitus without complications: Secondary | ICD-10-CM | POA: Diagnosis not present

## 2020-07-17 DIAGNOSIS — H35373 Puckering of macula, bilateral: Secondary | ICD-10-CM | POA: Diagnosis not present

## 2020-08-01 DIAGNOSIS — Z79899 Other long term (current) drug therapy: Secondary | ICD-10-CM | POA: Diagnosis not present

## 2020-08-01 DIAGNOSIS — M0589 Other rheumatoid arthritis with rheumatoid factor of multiple sites: Secondary | ICD-10-CM | POA: Diagnosis not present

## 2020-08-24 ENCOUNTER — Encounter (INDEPENDENT_AMBULATORY_CARE_PROVIDER_SITE_OTHER): Payer: MEDICARE | Admitting: Ophthalmology

## 2020-08-24 ENCOUNTER — Other Ambulatory Visit: Payer: Self-pay

## 2020-08-24 DIAGNOSIS — H35371 Puckering of macula, right eye: Secondary | ICD-10-CM | POA: Diagnosis not present

## 2020-08-24 DIAGNOSIS — H43813 Vitreous degeneration, bilateral: Secondary | ICD-10-CM

## 2020-08-24 DIAGNOSIS — E113393 Type 2 diabetes mellitus with moderate nonproliferative diabetic retinopathy without macular edema, bilateral: Secondary | ICD-10-CM

## 2020-08-24 DIAGNOSIS — E11311 Type 2 diabetes mellitus with unspecified diabetic retinopathy with macular edema: Secondary | ICD-10-CM | POA: Diagnosis not present

## 2020-08-24 DIAGNOSIS — I10 Essential (primary) hypertension: Secondary | ICD-10-CM

## 2020-08-24 DIAGNOSIS — H35033 Hypertensive retinopathy, bilateral: Secondary | ICD-10-CM

## 2020-09-08 DIAGNOSIS — E119 Type 2 diabetes mellitus without complications: Secondary | ICD-10-CM | POA: Diagnosis not present

## 2020-09-08 DIAGNOSIS — E785 Hyperlipidemia, unspecified: Secondary | ICD-10-CM | POA: Diagnosis not present

## 2020-09-08 DIAGNOSIS — R32 Unspecified urinary incontinence: Secondary | ICD-10-CM | POA: Diagnosis not present

## 2020-09-08 DIAGNOSIS — I119 Hypertensive heart disease without heart failure: Secondary | ICD-10-CM | POA: Diagnosis not present

## 2020-09-09 DIAGNOSIS — Z23 Encounter for immunization: Secondary | ICD-10-CM | POA: Diagnosis not present

## 2020-10-02 DIAGNOSIS — M8580 Other specified disorders of bone density and structure, unspecified site: Secondary | ICD-10-CM | POA: Diagnosis not present

## 2020-10-02 DIAGNOSIS — E119 Type 2 diabetes mellitus without complications: Secondary | ICD-10-CM | POA: Diagnosis not present

## 2020-10-02 DIAGNOSIS — Z79899 Other long term (current) drug therapy: Secondary | ICD-10-CM | POA: Diagnosis not present

## 2020-10-02 DIAGNOSIS — M0589 Other rheumatoid arthritis with rheumatoid factor of multiple sites: Secondary | ICD-10-CM | POA: Diagnosis not present

## 2020-10-02 DIAGNOSIS — M25569 Pain in unspecified knee: Secondary | ICD-10-CM | POA: Diagnosis not present

## 2020-10-02 DIAGNOSIS — M150 Primary generalized (osteo)arthritis: Secondary | ICD-10-CM | POA: Diagnosis not present

## 2020-10-02 DIAGNOSIS — M25519 Pain in unspecified shoulder: Secondary | ICD-10-CM | POA: Diagnosis not present

## 2020-10-19 DIAGNOSIS — I10 Essential (primary) hypertension: Secondary | ICD-10-CM | POA: Diagnosis not present

## 2020-10-19 DIAGNOSIS — E785 Hyperlipidemia, unspecified: Secondary | ICD-10-CM | POA: Diagnosis not present

## 2020-10-19 DIAGNOSIS — M179 Osteoarthritis of knee, unspecified: Secondary | ICD-10-CM | POA: Diagnosis not present

## 2020-10-19 DIAGNOSIS — G309 Alzheimer's disease, unspecified: Secondary | ICD-10-CM | POA: Diagnosis not present

## 2020-10-19 DIAGNOSIS — H3530 Unspecified macular degeneration: Secondary | ICD-10-CM | POA: Diagnosis not present

## 2020-10-19 DIAGNOSIS — E1151 Type 2 diabetes mellitus with diabetic peripheral angiopathy without gangrene: Secondary | ICD-10-CM | POA: Diagnosis not present

## 2020-10-19 DIAGNOSIS — I739 Peripheral vascular disease, unspecified: Secondary | ICD-10-CM | POA: Diagnosis not present

## 2020-10-19 DIAGNOSIS — Z794 Long term (current) use of insulin: Secondary | ICD-10-CM | POA: Diagnosis not present

## 2021-01-01 ENCOUNTER — Other Ambulatory Visit: Payer: Self-pay | Admitting: Internal Medicine

## 2021-01-01 DIAGNOSIS — Z1231 Encounter for screening mammogram for malignant neoplasm of breast: Secondary | ICD-10-CM

## 2021-02-14 ENCOUNTER — Ambulatory Visit: Payer: MEDICARE

## 2021-02-22 ENCOUNTER — Other Ambulatory Visit: Payer: Self-pay

## 2021-02-22 ENCOUNTER — Encounter (INDEPENDENT_AMBULATORY_CARE_PROVIDER_SITE_OTHER): Payer: MEDICARE | Admitting: Ophthalmology

## 2021-02-22 DIAGNOSIS — E113393 Type 2 diabetes mellitus with moderate nonproliferative diabetic retinopathy without macular edema, bilateral: Secondary | ICD-10-CM | POA: Diagnosis not present

## 2021-02-22 DIAGNOSIS — I10 Essential (primary) hypertension: Secondary | ICD-10-CM

## 2021-02-22 DIAGNOSIS — H35371 Puckering of macula, right eye: Secondary | ICD-10-CM

## 2021-02-22 DIAGNOSIS — H35033 Hypertensive retinopathy, bilateral: Secondary | ICD-10-CM

## 2021-02-22 DIAGNOSIS — H43813 Vitreous degeneration, bilateral: Secondary | ICD-10-CM | POA: Diagnosis not present

## 2021-02-26 DIAGNOSIS — L309 Dermatitis, unspecified: Secondary | ICD-10-CM | POA: Diagnosis not present

## 2021-02-26 DIAGNOSIS — R21 Rash and other nonspecific skin eruption: Secondary | ICD-10-CM | POA: Diagnosis not present

## 2021-03-01 DIAGNOSIS — E1151 Type 2 diabetes mellitus with diabetic peripheral angiopathy without gangrene: Secondary | ICD-10-CM | POA: Diagnosis not present

## 2021-03-01 DIAGNOSIS — E785 Hyperlipidemia, unspecified: Secondary | ICD-10-CM | POA: Diagnosis not present

## 2021-03-02 ENCOUNTER — Ambulatory Visit
Admission: EM | Admit: 2021-03-02 | Discharge: 2021-03-02 | Disposition: A | Discharge status: Home / Self Care | Payer: MEDICARE

## 2021-03-02 ENCOUNTER — Other Ambulatory Visit: Payer: Self-pay

## 2021-03-02 DIAGNOSIS — R030 Elevated blood-pressure reading, without diagnosis of hypertension: Secondary | ICD-10-CM | POA: Diagnosis not present

## 2021-03-02 DIAGNOSIS — R21 Rash and other nonspecific skin eruption: Secondary | ICD-10-CM

## 2021-03-02 NOTE — ED Triage Notes (Signed)
Pt presents with complaints of rash to her arms and legs. Reports she went to the doctor Monday and was given a cream that did not help. Unsure what rash is from.

## 2021-03-02 NOTE — Discharge Instructions (Addendum)
Continue to use the triamcinolone cream as directed.  Follow-up with your primary care provider next week.  Your blood pressure is elevated today at 175/67.  Please have this rechecked by your primary care provider in 1-2 weeks.

## 2021-03-02 NOTE — ED Provider Notes (Signed)
[facility]    CSN: [id number] Arrival date & time: 03/02/21  [place]      History   Chief Complaint Chief Complaint  Patient presents with  . Rash    HPI Carrie Patel is a 87 y.o. female.   Accompanied by her daughter, patient presents with pruritic rash on her extremities x1 week.  She was seen by her PCP 5 days ago and treated with triamcinolone cream; the patient's daughter misunderstood the directions for use and has been applying the cream twice a week instead of twice a day.  She also has given her mother Benadryl as needed for itching and using Benadryl cream on the rash.  She denies fever, chills, sore throat, cough, shortness of breath, or other symptoms.  Her medical history includes hypertension, diabetes, arthritis.  The history is provided by the patient, a relative and medical records.    Past Medical History:  Diagnosis Date  . Arthritis   . Diabetes mellitus without complication ([place])   . Hypertension     There are no problems to display for this patient.   Past Surgical History:  Procedure Laterality Date  . KNEE SURGERY      OB History   No obstetric history on file.      Home Medications    Prior to Admission medications   Medication Sig Start Date End Date Taking? Authorizing Provider  triamcinolone (KENALOG) 0.025 % ointment Apply 1 application topically 2 (two) times daily.   Yes [provider]  folic acid (FOLVITE) 1 MG tablet Take 1 mg by mouth daily.    [provider]  hydrALAZINE (APRESOLINE) 50 MG tablet Take 50 mg by mouth 2 (two) times daily.     [provider]  hydrochlorothiazide (HYDRODIURIL) 25 MG tablet Take 25 mg by mouth daily.    [provider]  insulin NPH Human (HUMULIN N,NOVOLIN N) 100 UNIT/ML injection Inject 45 Units into the skin 2 (two) times daily before a meal.    [provider]  lisinopril (PRINIVIL,ZESTRIL) 20 MG tablet Take 20 mg by mouth daily.     [provider]  methotrexate 2.5 MG tablet Take 20 mg by mouth once a week. Sunday    [provider]  metoprolol succinate (TOPROL-XL) 100 MG 24 hr tablet Take 100 mg by mouth daily. Take with or immediately following a meal.    [provider]  pravastatin (PRAVACHOL) 80 MG tablet Take 80 mg by mouth daily.    [provider]    Family History Family History  Problem Relation Age of Onset  . Diabetes Mother   . Hypertension Mother   . Diabetes Father   . Hypertension Father     Social History Social History   Tobacco Use  . Smoking status: Never Smoker  Substance Use Topics  . Alcohol use: No     Allergies   Patient has no known allergies.   Review of Systems Review of Systems  Constitutional: Negative for chills and fever.  HENT: Negative for ear pain and sore throat.   Eyes: Negative for pain and visual disturbance.  Respiratory: Negative for cough and shortness of breath.   Cardiovascular: Negative for chest pain and palpitations.  Gastrointestinal: Negative for abdominal pain and vomiting.  Genitourinary: Negative for dysuria and hematuria.  Musculoskeletal: Negative for arthralgias and back pain.  Skin: Positive for rash. Negative for color change.  Neurological: Negative for seizures and syncope.  All other systems reviewed  and are negative.    Physical Exam Triage Vital Signs ED Triage Vitals  Enc Vitals Group     BP      Pulse      Resp      Temp      Temp src      SpO2      Weight      Height      Head Circumference      Peak Flow      Pain Score      Pain Loc      Pain Edu?      Excl. in [place]?    No data found.  Updated Vital Signs BP (!) 175/67 (BP Location: Left Arm)   Pulse 62   Temp 98.4 F (36.9 C) (Oral)   Resp 20   SpO2 96%   Visual Acuity Right Eye Distance:   Left Eye Distance:   Bilateral Distance:    Right Eye Near:   Left Eye Near:    Bilateral Near:     Physical Exam Vitals  and nursing note reviewed.  Constitutional:      General: She is not in acute distress.    Appearance: She is well-developed. She is not ill-appearing.  HENT:     Head: Normocephalic and atraumatic.     Mouth/Throat:     Mouth: Mucous membranes are moist.  Eyes:     Conjunctiva/sclera: Conjunctivae normal.  Cardiovascular:     Rate and Rhythm: Normal rate and regular rhythm.     Heart sounds: Normal heart sounds.  Pulmonary:     Effort: Pulmonary effort is normal. No respiratory distress.     Breath sounds: Normal breath sounds.  Abdominal:     Palpations: Abdomen is soft.     Tenderness: There is no abdominal tenderness.  Musculoskeletal:     Cervical back: Neck supple.  Skin:    General: Skin is warm and dry.     Findings: Rash present.     Comments: Maculopapular rash on extremities.  No drainage or erythema.  Neurological:     Mental Status: She is alert. Mental status is at baseline.     Gait: Gait normal.  Psychiatric:        Mood and Affect: Mood normal.        Behavior: Behavior normal.      UC Treatments / Results  Labs (all labs ordered are listed, but only abnormal results are displayed) Labs Reviewed - No data to display  EKG   Radiology No results found.  Procedures Procedures (including critical care time)  Medications Ordered in UC Medications - No data to display  Initial Impression / Assessment and Plan / UC Course  I have reviewed the triage vital signs and the nursing notes.  Pertinent labs & imaging results that were available during my care of the patient were reviewed by me and considered in my medical decision making (see chart for details).   Rash.  Elevated blood pressure reading.  Instructed patient and her daughter to use the triamcinolone cream twice daily for 1 week.  Instructed them to follow-up with her PCP if her rash is not improving.  Discussed that her blood pressure is elevated today needs to be rechecked by her PCP in 1 to  2 weeks.  They agree to plan of care.   Final Clinical Impressions(s) / UC Diagnoses   Final diagnoses:  Rash  Elevated blood pressure reading  Discharge Instructions     Continue to use the triamcinolone cream as directed.  Follow-up with your primary care provider next week.  Your blood pressure is elevated today at 175/67.  Please have this rechecked by your primary care provider in 1-2 weeks.         ED Prescriptions    None     PDMP not reviewed this encounter.   Sharion Balloon, NP 03/02/21 1935

## 2021-03-08 DIAGNOSIS — H3530 Unspecified macular degeneration: Secondary | ICD-10-CM | POA: Diagnosis not present

## 2021-03-08 DIAGNOSIS — M069 Rheumatoid arthritis, unspecified: Secondary | ICD-10-CM | POA: Diagnosis not present

## 2021-03-08 DIAGNOSIS — E1151 Type 2 diabetes mellitus with diabetic peripheral angiopathy without gangrene: Secondary | ICD-10-CM | POA: Diagnosis not present

## 2021-03-08 DIAGNOSIS — Z0000 Encounter for general adult medical examination without abnormal findings: Secondary | ICD-10-CM | POA: Diagnosis not present

## 2021-03-08 DIAGNOSIS — M179 Osteoarthritis of knee, unspecified: Secondary | ICD-10-CM | POA: Diagnosis not present

## 2021-03-08 DIAGNOSIS — E785 Hyperlipidemia, unspecified: Secondary | ICD-10-CM | POA: Diagnosis not present

## 2021-03-08 DIAGNOSIS — G309 Alzheimer's disease, unspecified: Secondary | ICD-10-CM | POA: Diagnosis not present

## 2021-03-08 DIAGNOSIS — I10 Essential (primary) hypertension: Secondary | ICD-10-CM | POA: Diagnosis not present

## 2021-03-08 DIAGNOSIS — Z1339 Encounter for screening examination for other mental health and behavioral disorders: Secondary | ICD-10-CM | POA: Diagnosis not present

## 2021-03-08 DIAGNOSIS — I739 Peripheral vascular disease, unspecified: Secondary | ICD-10-CM | POA: Diagnosis not present

## 2021-03-08 DIAGNOSIS — Z1331 Encounter for screening for depression: Secondary | ICD-10-CM | POA: Diagnosis not present

## 2021-03-08 DIAGNOSIS — H903 Sensorineural hearing loss, bilateral: Secondary | ICD-10-CM | POA: Diagnosis not present

## 2021-03-23 ENCOUNTER — Ambulatory Visit
Admission: RE | Admit: 2021-03-23 | Discharge: 2021-03-23 | Disposition: A | Discharge status: Home / Self Care | Payer: MEDICARE | Source: Ambulatory Visit | Attending: Internal Medicine | Admitting: Internal Medicine

## 2021-03-23 ENCOUNTER — Other Ambulatory Visit: Payer: Self-pay

## 2021-03-23 DIAGNOSIS — Z1231 Encounter for screening mammogram for malignant neoplasm of breast: Secondary | ICD-10-CM | POA: Diagnosis not present

## 2021-04-03 DIAGNOSIS — M8580 Other specified disorders of bone density and structure, unspecified site: Secondary | ICD-10-CM | POA: Diagnosis not present

## 2021-04-03 DIAGNOSIS — M150 Primary generalized (osteo)arthritis: Secondary | ICD-10-CM | POA: Diagnosis not present

## 2021-04-03 DIAGNOSIS — Z79899 Other long term (current) drug therapy: Secondary | ICD-10-CM | POA: Diagnosis not present

## 2021-04-03 DIAGNOSIS — M25519 Pain in unspecified shoulder: Secondary | ICD-10-CM | POA: Diagnosis not present

## 2021-04-03 DIAGNOSIS — E119 Type 2 diabetes mellitus without complications: Secondary | ICD-10-CM | POA: Diagnosis not present

## 2021-04-03 DIAGNOSIS — M25569 Pain in unspecified knee: Secondary | ICD-10-CM | POA: Diagnosis not present

## 2021-04-03 DIAGNOSIS — M0589 Other rheumatoid arthritis with rheumatoid factor of multiple sites: Secondary | ICD-10-CM | POA: Diagnosis not present

## 2021-04-05 ENCOUNTER — Ambulatory Visit: Payer: MEDICARE

## 2021-05-08 DIAGNOSIS — E1151 Type 2 diabetes mellitus with diabetic peripheral angiopathy without gangrene: Secondary | ICD-10-CM | POA: Diagnosis not present

## 2021-05-08 DIAGNOSIS — L209 Atopic dermatitis, unspecified: Secondary | ICD-10-CM | POA: Diagnosis not present

## 2021-05-08 DIAGNOSIS — L299 Pruritus, unspecified: Secondary | ICD-10-CM | POA: Diagnosis not present

## 2021-06-06 DIAGNOSIS — E1151 Type 2 diabetes mellitus with diabetic peripheral angiopathy without gangrene: Secondary | ICD-10-CM | POA: Diagnosis not present

## 2021-06-06 DIAGNOSIS — E785 Hyperlipidemia, unspecified: Secondary | ICD-10-CM | POA: Diagnosis not present

## 2021-06-22 DIAGNOSIS — L300 Nummular dermatitis: Secondary | ICD-10-CM | POA: Diagnosis not present

## 2021-07-05 DIAGNOSIS — G309 Alzheimer's disease, unspecified: Secondary | ICD-10-CM | POA: Diagnosis not present

## 2021-07-05 DIAGNOSIS — Z794 Long term (current) use of insulin: Secondary | ICD-10-CM | POA: Diagnosis not present

## 2021-07-05 DIAGNOSIS — E785 Hyperlipidemia, unspecified: Secondary | ICD-10-CM | POA: Diagnosis not present

## 2021-07-05 DIAGNOSIS — I10 Essential (primary) hypertension: Secondary | ICD-10-CM | POA: Diagnosis not present

## 2021-07-05 DIAGNOSIS — E1151 Type 2 diabetes mellitus with diabetic peripheral angiopathy without gangrene: Secondary | ICD-10-CM | POA: Diagnosis not present

## 2021-07-09 DIAGNOSIS — M069 Rheumatoid arthritis, unspecified: Secondary | ICD-10-CM | POA: Diagnosis not present

## 2021-07-09 DIAGNOSIS — G309 Alzheimer's disease, unspecified: Secondary | ICD-10-CM | POA: Diagnosis not present

## 2021-07-09 DIAGNOSIS — Z794 Long term (current) use of insulin: Secondary | ICD-10-CM | POA: Diagnosis not present

## 2021-07-09 DIAGNOSIS — E1151 Type 2 diabetes mellitus with diabetic peripheral angiopathy without gangrene: Secondary | ICD-10-CM | POA: Diagnosis not present

## 2021-07-09 DIAGNOSIS — I10 Essential (primary) hypertension: Secondary | ICD-10-CM | POA: Diagnosis not present

## 2021-07-09 DIAGNOSIS — I739 Peripheral vascular disease, unspecified: Secondary | ICD-10-CM | POA: Diagnosis not present

## 2021-08-08 DIAGNOSIS — L282 Other prurigo: Secondary | ICD-10-CM | POA: Diagnosis not present

## 2021-08-08 DIAGNOSIS — I10 Essential (primary) hypertension: Secondary | ICD-10-CM | POA: Diagnosis not present

## 2021-08-08 DIAGNOSIS — M069 Rheumatoid arthritis, unspecified: Secondary | ICD-10-CM | POA: Diagnosis not present

## 2021-08-09 DIAGNOSIS — L299 Pruritus, unspecified: Secondary | ICD-10-CM | POA: Diagnosis not present

## 2021-08-09 DIAGNOSIS — L309 Dermatitis, unspecified: Secondary | ICD-10-CM | POA: Diagnosis not present

## 2021-08-09 DIAGNOSIS — E1151 Type 2 diabetes mellitus with diabetic peripheral angiopathy without gangrene: Secondary | ICD-10-CM | POA: Diagnosis not present

## 2021-08-15 DIAGNOSIS — M25519 Pain in unspecified shoulder: Secondary | ICD-10-CM | POA: Diagnosis not present

## 2021-08-15 DIAGNOSIS — R21 Rash and other nonspecific skin eruption: Secondary | ICD-10-CM | POA: Diagnosis not present

## 2021-08-15 DIAGNOSIS — M8580 Other specified disorders of bone density and structure, unspecified site: Secondary | ICD-10-CM | POA: Diagnosis not present

## 2021-08-15 DIAGNOSIS — M25569 Pain in unspecified knee: Secondary | ICD-10-CM | POA: Diagnosis not present

## 2021-08-15 DIAGNOSIS — E119 Type 2 diabetes mellitus without complications: Secondary | ICD-10-CM | POA: Diagnosis not present

## 2021-08-15 DIAGNOSIS — Z79899 Other long term (current) drug therapy: Secondary | ICD-10-CM | POA: Diagnosis not present

## 2021-08-15 DIAGNOSIS — M150 Primary generalized (osteo)arthritis: Secondary | ICD-10-CM | POA: Diagnosis not present

## 2021-08-15 DIAGNOSIS — M0589 Other rheumatoid arthritis with rheumatoid factor of multiple sites: Secondary | ICD-10-CM | POA: Diagnosis not present

## 2021-08-16 DIAGNOSIS — L309 Dermatitis, unspecified: Secondary | ICD-10-CM | POA: Diagnosis not present

## 2021-08-16 DIAGNOSIS — E785 Hyperlipidemia, unspecified: Secondary | ICD-10-CM | POA: Diagnosis not present

## 2021-08-16 DIAGNOSIS — Z794 Long term (current) use of insulin: Secondary | ICD-10-CM | POA: Diagnosis not present

## 2021-08-16 DIAGNOSIS — I10 Essential (primary) hypertension: Secondary | ICD-10-CM | POA: Diagnosis not present

## 2021-08-16 DIAGNOSIS — L0889 Other specified local infections of the skin and subcutaneous tissue: Secondary | ICD-10-CM | POA: Diagnosis not present

## 2021-08-16 DIAGNOSIS — E1151 Type 2 diabetes mellitus with diabetic peripheral angiopathy without gangrene: Secondary | ICD-10-CM | POA: Diagnosis not present

## 2021-08-28 ENCOUNTER — Encounter (INDEPENDENT_AMBULATORY_CARE_PROVIDER_SITE_OTHER): Payer: MEDICARE | Admitting: Ophthalmology

## 2021-08-28 ENCOUNTER — Other Ambulatory Visit: Payer: Self-pay

## 2021-08-28 DIAGNOSIS — H43813 Vitreous degeneration, bilateral: Secondary | ICD-10-CM

## 2021-08-28 DIAGNOSIS — I10 Essential (primary) hypertension: Secondary | ICD-10-CM

## 2021-08-28 DIAGNOSIS — H35371 Puckering of macula, right eye: Secondary | ICD-10-CM | POA: Diagnosis not present

## 2021-08-28 DIAGNOSIS — E113393 Type 2 diabetes mellitus with moderate nonproliferative diabetic retinopathy without macular edema, bilateral: Secondary | ICD-10-CM

## 2021-08-28 DIAGNOSIS — H35033 Hypertensive retinopathy, bilateral: Secondary | ICD-10-CM

## 2021-08-29 DIAGNOSIS — Z91013 Allergy to seafood: Secondary | ICD-10-CM | POA: Diagnosis not present

## 2021-08-29 DIAGNOSIS — E119 Type 2 diabetes mellitus without complications: Secondary | ICD-10-CM | POA: Diagnosis not present

## 2021-08-29 DIAGNOSIS — T7849XD Other allergy, subsequent encounter: Secondary | ICD-10-CM | POA: Diagnosis not present

## 2021-08-29 DIAGNOSIS — R7982 Elevated C-reactive protein (CRP): Secondary | ICD-10-CM | POA: Diagnosis not present

## 2021-08-29 DIAGNOSIS — R7989 Other specified abnormal findings of blood chemistry: Secondary | ICD-10-CM | POA: Diagnosis not present

## 2021-09-05 DIAGNOSIS — Z23 Encounter for immunization: Secondary | ICD-10-CM | POA: Diagnosis not present

## 2021-10-01 DIAGNOSIS — H04123 Dry eye syndrome of bilateral lacrimal glands: Secondary | ICD-10-CM | POA: Diagnosis not present

## 2021-10-01 DIAGNOSIS — Z961 Presence of intraocular lens: Secondary | ICD-10-CM | POA: Diagnosis not present

## 2021-10-01 DIAGNOSIS — H5702 Anisocoria: Secondary | ICD-10-CM | POA: Diagnosis not present

## 2021-10-01 DIAGNOSIS — H35373 Puckering of macula, bilateral: Secondary | ICD-10-CM | POA: Diagnosis not present

## 2021-10-01 DIAGNOSIS — E119 Type 2 diabetes mellitus without complications: Secondary | ICD-10-CM | POA: Diagnosis not present

## 2021-10-03 ENCOUNTER — Other Ambulatory Visit: Payer: Self-pay

## 2021-10-03 ENCOUNTER — Ambulatory Visit
Admission: RE | Admit: 2021-10-03 | Discharge: 2021-10-03 | Disposition: A | Discharge status: Home / Self Care | Payer: MEDICARE | Source: Ambulatory Visit | Attending: Internal Medicine | Admitting: Internal Medicine

## 2021-10-03 ENCOUNTER — Other Ambulatory Visit: Payer: Self-pay | Admitting: Internal Medicine

## 2021-10-03 DIAGNOSIS — W19XXXA Unspecified fall, initial encounter: Secondary | ICD-10-CM

## 2021-10-03 DIAGNOSIS — R42 Dizziness and giddiness: Secondary | ICD-10-CM | POA: Diagnosis not present

## 2021-10-22 ENCOUNTER — Ambulatory Visit (INDEPENDENT_AMBULATORY_CARE_PROVIDER_SITE_OTHER): Payer: MEDICARE | Admitting: Endocrinology

## 2021-10-22 ENCOUNTER — Other Ambulatory Visit: Payer: Self-pay

## 2021-10-22 DIAGNOSIS — Z794 Long term (current) use of insulin: Secondary | ICD-10-CM

## 2021-10-22 DIAGNOSIS — E119 Type 2 diabetes mellitus without complications: Secondary | ICD-10-CM | POA: Diagnosis not present

## 2021-10-22 NOTE — Patient Instructions (Addendum)
good diet and exercise significantly improve the control of your diabetes.  please let me know if you wish to be referred to a dietician.  high blood sugar is very risky to your health.  you should see an eye doctor and dentist every year.  It is very important to get all recommended vaccinations.  Controlling your blood pressure and cholesterol drastically reduces the damage diabetes does to your body.  Those who smoke should quit.  Please discuss these with your doctor.  Please follow up with your dermatologist about the itching.   Please continue the same 2 insulins. I would be happy to see you back here as needed

## 2021-10-22 NOTE — Progress Notes (Signed)
Subjective:    Patient ID: Carrie Patel, female    DOB: 10-16-1931, 87 y.o.   MRN: [id number]  HPI Dtr provides some hx, due to pt's memory loss.  pt is referred by Dr Philip Aspen, for diabetes.  Pt states DM was dx'ed in 2000; she is unaware of any chronic complications; she has been on insulin since approx 2010; pt says her diet is good, but exercise is poor; she has never had GDM, pancreatitis, pancreatic surgery, or DKA.  She last had severe hypoglycemia in 2019.   Pt takes Tresiba 16-18/d, and PRN Novolog, 2-6 units QAC.  She has mild hypoglycemia approx Qweek.  Dtr says cbg varies from 68-269.  It is in general higher as the day goes on. Dtr is with pt all day, and gives pt her insulin.  The only exception is pt misses lunch insulin, due to being a [facility].   Past Medical History:  Diagnosis Date   Arthritis    Diabetes mellitus without complication ([place])    Hypertension     Past Surgical History:  Procedure Laterality Date   KNEE SURGERY      Social History   Socioeconomic History   Marital status: Single    Spouse name: Not on file   Number of children: Not on file   Years of education: Not on file   Highest education level: Not on file  Occupational History   Not on file  Tobacco Use   Smoking status: Never   Smokeless tobacco: Not on file  Substance and Sexual Activity   Alcohol use: No   Drug use: Not on file   Sexual activity: Not on file  Other Topics Concern   Not on file  Social History Narrative   Not on file   Social Determinants of Health   Financial Resource Strain: Not on file  Food Insecurity: Not on file  Transportation Needs: Not on file  Physical Activity: Not on file  Stress: Not on file  Social Connections: Not on file  Intimate Partner Violence: Not on file    Current Outpatient Medications on File Prior to Visit  Medication Sig Dispense Refill   folic acid (FOLVITE) 1 MG tablet Take 1 mg by mouth daily.     hydrALAZINE  (APRESOLINE) 50 MG tablet Take 50 mg by mouth 2 (two) times daily.      hydrochlorothiazide (HYDRODIURIL) 25 MG tablet Take 25 mg by mouth daily.     insulin NPH Human (HUMULIN N,NOVOLIN N) 100 UNIT/ML injection Inject 45 Units into the skin 2 (two) times daily before a meal.     lisinopril (PRINIVIL,ZESTRIL) 20 MG tablet Take 20 mg by mouth daily.     methotrexate 2.5 MG tablet Take 20 mg by mouth once a week. Sunday     metoprolol succinate (TOPROL-XL) 100 MG 24 hr tablet Take 100 mg by mouth daily. Take with or immediately following a meal.     pravastatin (PRAVACHOL) 80 MG tablet Take 80 mg by mouth daily.     triamcinolone (KENALOG) 0.025 % ointment Apply 1 application topically 2 (two) times daily.     No current facility-administered medications on file prior to visit.    No Known Allergies  Family History  Problem Relation Age of Onset   Diabetes Mother    Hypertension Mother    Diabetes Father    Hypertension Father     BP 130/60 (BP Location: Right Arm, Patient Position: Sitting, Cuff Size:  Normal)   Pulse 70   Ht 5\' 5"  (1.651 m)   Wt 164 lb 12.8 oz (74.8 kg)   SpO2 98%   BMI 27.42 kg/m     Review of Systems denies weight loss, sob, and n/v.  Dtr repeatedly asks about itching.      Objective:   Physical Exam Pulses: dorsalis pedis intact bilat.   MSK: no deformity of the feet, but there are bilat hammer toes.   CV: 1+ bilat leg edema Skin:  no ulcer on the feet.  normal color and temp on the feet.   Neuro: sensation is intact to touch on the feet.   Ext: there is a callus over the left bunion area.     outside test results are reviewed: A1c=8.6%  Lab Results  Component Value Date   CREATININE 0.90 06/14/2014   BUN 11 06/14/2014   NA 137 06/14/2014   K 4.3 06/14/2014   CL 105 06/14/2014       Assessment & Plan:  Insulin-requiring type 2 DM: uncontrolled. I advised pt to add non-insulin rx, but she declines.   Itching: despite this appt for DM, dtr  is much more interested in the itching.  I told her I can't help her.    Patient Instructions  good diet and exercise significantly improve the control of your diabetes.  please let me know if you wish to be referred to a dietician.  high blood sugar is very risky to your health.  you should see an eye doctor and dentist every year.  It is very important to get all recommended vaccinations.  Controlling your blood pressure and cholesterol drastically reduces the damage diabetes does to your body.  Those who smoke should quit.  Please discuss these with your doctor.  Please follow up with your dermatologist about the itching.   Please continue the same 2 insulins. I would be happy to see you back here as needed

## 2021-10-24 DIAGNOSIS — E119 Type 2 diabetes mellitus without complications: Secondary | ICD-10-CM | POA: Insufficient documentation

## 2021-10-24 DIAGNOSIS — L986 Other infiltrative disorders of the skin and subcutaneous tissue: Secondary | ICD-10-CM | POA: Diagnosis not present

## 2021-10-24 DIAGNOSIS — R21 Rash and other nonspecific skin eruption: Secondary | ICD-10-CM | POA: Diagnosis not present

## 2021-10-24 DIAGNOSIS — D485 Neoplasm of uncertain behavior of skin: Secondary | ICD-10-CM | POA: Diagnosis not present

## 2021-11-20 DIAGNOSIS — H5702 Anisocoria: Secondary | ICD-10-CM | POA: Diagnosis not present

## 2021-11-22 DIAGNOSIS — Z794 Long term (current) use of insulin: Secondary | ICD-10-CM | POA: Diagnosis not present

## 2021-11-22 DIAGNOSIS — E1151 Type 2 diabetes mellitus with diabetic peripheral angiopathy without gangrene: Secondary | ICD-10-CM | POA: Diagnosis not present

## 2021-11-22 DIAGNOSIS — I10 Essential (primary) hypertension: Secondary | ICD-10-CM | POA: Diagnosis not present

## 2021-11-22 DIAGNOSIS — E785 Hyperlipidemia, unspecified: Secondary | ICD-10-CM | POA: Diagnosis not present

## 2021-12-05 ENCOUNTER — Emergency Department (HOSPITAL_COMMUNITY)
Admission: EM | Admit: 2021-12-05 | Discharge: 2021-12-05 | Disposition: A | Discharge status: Home / Self Care | Payer: MEDICARE | Attending: Emergency Medicine | Admitting: Emergency Medicine

## 2021-12-05 ENCOUNTER — Other Ambulatory Visit: Payer: Self-pay

## 2021-12-05 ENCOUNTER — Encounter (HOSPITAL_COMMUNITY): Payer: Self-pay

## 2021-12-05 DIAGNOSIS — R21 Rash and other nonspecific skin eruption: Secondary | ICD-10-CM

## 2021-12-05 DIAGNOSIS — Z794 Long term (current) use of insulin: Secondary | ICD-10-CM | POA: Diagnosis not present

## 2021-12-05 DIAGNOSIS — I10 Essential (primary) hypertension: Secondary | ICD-10-CM | POA: Insufficient documentation

## 2021-12-05 DIAGNOSIS — E119 Type 2 diabetes mellitus without complications: Secondary | ICD-10-CM | POA: Diagnosis not present

## 2021-12-05 DIAGNOSIS — Z79899 Other long term (current) drug therapy: Secondary | ICD-10-CM | POA: Diagnosis not present

## 2021-12-05 LAB — CBC WITH DIFFERENTIAL/PLATELET
Abs Immature Granulocytes: 0.05 10*3/uL (ref 0.00–0.07)
Basophils Absolute: 0 10*3/uL (ref 0.0–0.1)
Basophils Relative: 0 %
Eosinophils Absolute: 0.3 10*3/uL (ref 0.0–0.5)
Eosinophils Relative: 2 %
HCT: 47.9 % — ABNORMAL HIGH (ref 36.0–46.0)
Hemoglobin: 14.6 g/dL (ref 12.0–15.0)
Immature Granulocytes: 0 %
Lymphocytes Relative: 14 %
Lymphs Abs: 1.6 10*3/uL (ref 0.7–4.0)
MCH: 25.9 pg — ABNORMAL LOW (ref 26.0–34.0)
MCHC: 30.5 g/dL (ref 30.0–36.0)
MCV: 85.1 fL (ref 80.0–100.0)
Monocytes Absolute: 1 10*3/uL (ref 0.1–1.0)
Monocytes Relative: 9 %
Neutro Abs: 8.8 10*3/uL — ABNORMAL HIGH (ref 1.7–7.7)
Neutrophils Relative %: 75 %
Platelets: 348 10*3/uL (ref 150–400)
RBC: 5.63 MIL/uL — ABNORMAL HIGH (ref 3.87–5.11)
RDW: 14.4 % (ref 11.5–15.5)
WBC: 11.8 10*3/uL — ABNORMAL HIGH (ref 4.0–10.5)
nRBC: 0 % (ref 0.0–0.2)

## 2021-12-05 LAB — COMPREHENSIVE METABOLIC PANEL
ALT: 13 U/L (ref 0–44)
AST: 13 U/L — ABNORMAL LOW (ref 15–41)
Albumin: 3.8 g/dL (ref 3.5–5.0)
Alkaline Phosphatase: 75 U/L (ref 38–126)
Anion gap: 6 (ref 5–15)
BUN: 12 mg/dL (ref 8–23)
CO2: 27 mmol/L (ref 22–32)
Calcium: 10.1 mg/dL (ref 8.9–10.3)
Chloride: 103 mmol/L (ref 98–111)
Creatinine, Ser: 0.77 mg/dL (ref 0.44–1.00)
GFR, Estimated: >60 mL/min (ref >60)
Glucose, Bld: 139 mg/dL — ABNORMAL HIGH (ref 70–99)
Potassium: 4.3 mmol/L (ref 3.5–5.1)
Sodium: 136 mmol/L (ref 135–145)
Total Bilirubin: 0.5 mg/dL (ref 0.3–1.2)
Total Protein: 8 g/dL (ref 6.5–8.1)

## 2021-12-05 MED ORDER — PERMETHRIN 5 % EX CREA: TOPICAL_CREAM | CUTANEOUS | 1 refills | Status: DC

## 2021-12-05 NOTE — ED Triage Notes (Signed)
Pt reports with a rash on her arms and chest that has been happening since March. Pt states that she gets short of breath when she is itching and scratching.

## 2021-12-05 NOTE — ED Notes (Signed)
Pt and daughter state understanding of dc instructions, importance of follow up, and prescription. Pt and daughter deny questions or concerns upon dc. Pt declined wheelchair assistance upon dc. Pt ambulated out of ed w/ steady gait. No belongings left in room upon dc.
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
## 2021-12-05 NOTE — Discharge Instructions (Addendum)
1.  You have had your rash for a long time.  You have had a number of evaluations for this condition.  At this time, it remains unclear what the underlying causes.  Due to the fact that you have not gotten resolution with several different treatment programs and your rash is intensely itchy with small bumps all over the arms that have been flaring for over 6 months, I am prescribing a treatment for scabies as a trial to see if there is any change in the rash.  Follow instructions for 1 application of the cream over your skin.  If rashes significantly improved, but not resolved after a week, you may try 1 more application.  No more treatment is necessary after this.  If this treatment does not make the rash go away, then your diagnosis is not scabies.  The most important thing for you to do next is to continue to work closely with your dermatologist and possibly repeat biopsies if necessary or additional referral to subspecialist. 2.  To control itching, you can continue to use your oral antihistamines and topical creams prescribed by your dermatologist.

## 2022-01-04 ENCOUNTER — Telehealth: Payer: Self-pay

## 2022-01-04 ENCOUNTER — Other Ambulatory Visit: Payer: Self-pay

## 2022-01-04 ENCOUNTER — Ambulatory Visit (INDEPENDENT_AMBULATORY_CARE_PROVIDER_SITE_OTHER): Payer: Medicare Other | Admitting: Infectious Diseases

## 2022-01-04 ENCOUNTER — Encounter: Payer: Self-pay | Admitting: Infectious Diseases

## 2022-01-04 VITALS — BP 156/74 | HR 68 | Temp 98.1°F | Wt 163.6 lb

## 2022-01-04 DIAGNOSIS — A539 Syphilis, unspecified: Secondary | ICD-10-CM

## 2022-01-04 DIAGNOSIS — M069 Rheumatoid arthritis, unspecified: Secondary | ICD-10-CM | POA: Diagnosis not present

## 2022-01-04 DIAGNOSIS — R21 Rash and other nonspecific skin eruption: Secondary | ICD-10-CM

## 2022-01-04 MED ORDER — PENICILLIN G BENZATHINE 1200000 UNIT/2ML IM SUSY
1.2000 10*6.[IU] | PREFILLED_SYRINGE | Freq: Once | INTRAMUSCULAR | Status: AC
  Administered 2022-01-04: 1.2 10*6.[IU] via INTRAMUSCULAR

## 2022-01-04 NOTE — Telephone Encounter (Signed)
Per MD faxed signed release of information to [facility] for Skin biopsy results. Confirmation received that fax went through. Dr. Barron Alvine: [phone] F: [phone]

## 2022-01-04 NOTE — Progress Notes (Addendum)
Patient Active Problem List   Diagnosis Date Noted   Diabetes ([place]) 10/24/2021      Subjective: 88 YO female with PMH of DM, HTN, RA on weekly methotrexate who is referred for positive syphilis serology. Daughter serves as a Scientist, physiological. Outside medical records and EMR reviewed.  Accompanied by daughter. She started breaking out in March 2022 and says has seen every doctor in [place] including Endocrinology, Allergy and Dermatology. Rashes are associated with itching and she wakes up at night. Does not seem to be painful. Denies use of any new medications, lotions etc that could be related. She has also used ketoconazole shampoo and has used permethrin cream but nothing seems to help. Seen in the UC  12/27/21 and has asked to do full panel of blood test where she was found to have positive for syphilis. She is currently taking doxycyline - D 7. Patient has Vascular dementia for last 10 years. She was treated for syphilis 30 years ago with possibly 3 shots of penicillin ( daughter is sure its more than 1). Her husband also expired 30 years ago and daughter thinks last time she was sexually active was 30 years ago. Daughter denies any reported symptoms of headache, blurry vision, neck pain, new weakness/tingling.   They also follow [facility] Dr Lorenda Ishihara [place] where skin biopsy was done ( unsure about results). Has also used short courses of prednisone in the past  ( March and August 2022) which helps. She wakes up at night and starts to itch. Daughter applies home made cream with baking soda and baby oat milk to calm her down.   Follows Dr Dossie Der for RA and is on weekly Methotrexate.   12/27/21 RPR 1:2, Reactive, FTA Abs reactive  HIV ag/ab 4thgen NR Acute hepatitis panel NR UA unremarkable   Review of Systems: ROS patient is demented   Past Medical History:  Diagnosis Date   Arthritis    Diabetes mellitus without complication ([place])    Hypertension    Past  Surgical History:  Procedure Laterality Date   KNEE SURGERY     Social History   Tobacco Use   Smoking status: Never  Substance Use Topics   Alcohol use: No    Family History  Problem Relation Age of Onset   Diabetes Mother    Hypertension Mother    Diabetes Father    Hypertension Father     No Known Allergies  Health Maintenance  Topic Date Due   HEMOGLOBIN A1C  Never done   Pneumonia Vaccine 32+ Years old (1 - PCV) Never done   OPHTHALMOLOGY EXAM  Never done   TETANUS/TDAP  Never done   Zoster Vaccines- Shingrix (1 of 2) Never done   DEXA SCAN  Never done   COVID-19 Vaccine (2 - Moderna risk series) 02/17/2020   FOOT EXAM  10/22/2022   INFLUENZA VACCINE  Completed   HPV VACCINES  Aged Out    Objective: BP (!) 156/74    Pulse 68    Temp 98.1 F (36.7 C) (Oral)    Wt 163 lb 9.6 oz (74.2 kg)    SpO2 100%    BMI 27.22 kg/m    Physical Exam Constitutional:      Appearance: Normal appearance. Elderly female  HENT:     Head: Normocephalic and atraumatic.      Mouth: Mucous membranes are moist.  Eyes:    Conjunctiva/sclera: Conjunctivae normal.  Pupils: Pupils are equal, round  Cardiovascular:     Rate and Rhythm: Normal rate and regular rhythm.     Heart sounds:   Pulmonary:     Effort: Pulmonary effort is normal.     Breath sounds: Normal breath sounds.   Abdominal:     General: Abdomen is flat.     Palpations: Abdomen is soft.   Musculoskeletal:        General: Normal range of motion.   Skin:    General: Skin is warm and dry.     Comments: scattered chronic appearing papular with dark centered rashes in the upper and lower extremities             Neurological:     General: No focal deficit present.     Mental Status: She is alert and oriented to person, place, and time.   Psychiatric:        Mood and Affect: Mood normal.   Lab Results Lab Results  Component Value Date   WBC 11.8 (H) 12/05/2021   HGB 14.6 12/05/2021   HCT  47.9 (H) 12/05/2021   MCV 85.1 12/05/2021   PLT 348 12/05/2021    Lab Results  Component Value Date   CREATININE 0.77 12/05/2021   BUN 12 12/05/2021   NA 136 12/05/2021   K 4.3 12/05/2021   CL 103 12/05/2021   CO2 27 12/05/2021    Lab Results  Component Value Date   ALT 13 12/05/2021   AST 13 (L) 12/05/2021   ALKPHOS 75 12/05/2021   BILITOT 0.5 12/05/2021    No results found for: CHOL, HDL, LDLCALC, LDLDIRECT, TRIG, CHOLHDL No results found for: LABRPR, RPRTITER No results found for: HIV1RNAQUANT, HIV1RNAVL, CD4TABS   Problem List Items Addressed This Visit       Musculoskeletal and Integument   Rash and other nonspecific skin eruption   Rheumatoid arthritis ([place])     Other   Syphilis - Primary   Relevant Orders   RPR   A/P 22 Y O female with h/o RA on weekly Methotrexate, HTN and DM with previous h/o treated syphilis here with positive syphilis serology   Syphilis Treated - Per daughter's history, RPR titre seems to be consistent with treated syphilis. Her skin rashes do not appear to be syphilitic especially given she has not been sexual active for last 30 years. She has vascular dementia for last 8-10 years otherwise , no reported neurological symptoms.  Plan for benzathine pen C 2.4 million units weekly , 3 doses for probable late latent syphilis.  Follow up with Dermatology for rashes evaluation  Follow up with Rheumatology for RA. ? 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 Professional time spent includes the following activities: Preparing to see the patient (review of tests), Obtaining and/or reviewing separately obtained history (admission/discharge record), Performing a medically appropriate examination and/or evaluation , Ordering  medications/tests/procedures, referring and communicating with other health care professionals, Documenting clinical information in the EMR, Independently interpreting results (not separately reported), Communicating results to the patient/family/caregiver, Counseling and educating the patient/family/caregiver and Care coordination (not separately reported).   Wilber Oliphant, [facility] 01/04/2022, 8:46 AM

## 2022-01-06 DIAGNOSIS — M069 Rheumatoid arthritis, unspecified: Secondary | ICD-10-CM | POA: Insufficient documentation

## 2022-01-06 DIAGNOSIS — R21 Rash and other nonspecific skin eruption: Secondary | ICD-10-CM | POA: Insufficient documentation

## 2022-01-06 DIAGNOSIS — A539 Syphilis, unspecified: Secondary | ICD-10-CM | POA: Insufficient documentation

## 2022-01-08 LAB — FLUORESCENT TREPONEMAL AB(FTA)-IGG-BLD: Fluorescent Treponemal ABS: REACTIVE — AB

## 2022-01-08 LAB — RPR TITER: RPR Titer: 1:2 {titer} — ABNORMAL HIGH

## 2022-01-08 LAB — RPR: RPR Ser Ql: REACTIVE — AB

## 2022-01-10 ENCOUNTER — Telehealth: Payer: Self-pay

## 2022-01-10 ENCOUNTER — Other Ambulatory Visit: Payer: Self-pay

## 2022-01-10 ENCOUNTER — Ambulatory Visit (INDEPENDENT_AMBULATORY_CARE_PROVIDER_SITE_OTHER): Payer: Medicare Other

## 2022-01-10 DIAGNOSIS — A539 Syphilis, unspecified: Secondary | ICD-10-CM | POA: Diagnosis not present

## 2022-01-10 MED ORDER — PENICILLIN G BENZATHINE 1200000 UNIT/2ML IM SUSY
2.4000 10*6.[IU] | PREFILLED_SYRINGE | Freq: Once | INTRAMUSCULAR | Status: AC
  Administered 2022-01-10: 2.4 10*6.[IU] via INTRAMUSCULAR

## 2022-01-10 NOTE — Telephone Encounter (Signed)
Called patient's daughter to relay Dr. Levonne Spiller message, no answer. Left HIPAA compliant voicemail requesting callback.   Beryle Flock, RN

## 2022-01-10 NOTE — Progress Notes (Signed)
Patient in the office today for 2 of 3 weekly bicillin injections. Patient tolerated injections well. Chaperone present along with patient's daughter in the room.  Daughter has question concerning if patient should be taking her methotrexate while receiving treatment with penicillin weekly or if she should stop it? Daughter also reports that patient has slight itching at night since first injection, last for about 3 days then goes away. Daughter reports that she has been giving her OTC benadryl for the itching which seems to help. No other concerns voiced. Reminded patient of upcoming visit next week to completed #3 weekly injection. Routing this note to provider for advise. Eugenia Mcalpine

## 2022-01-10 NOTE — Telephone Encounter (Signed)
-----   Message from Rosiland Oz, MD sent at 01/10/2022  3:35 PM EST ----- No need to hold methotrexate because of penicillin injections.  She can however discuss with her Rheumatologist whether methotrexate or Rheumatoid Arthritis could be causing the rashes.  ----- Message ----- From: Eugenia Mcalpine, LPN Sent: 12/11/1436   9:38 AM EST To: Rosiland Oz, MD, [facility] Triage Nurse Pool  Daughter had question regarding if patient should hold methotrexate while receiving weekly bicillin injections and also she would like to report that patient has been experiencing some mild itching 3 days post injections but OTC benadryl has been effective.   Please advise. Thanks

## 2022-01-10 NOTE — Telephone Encounter (Signed)
error 

## 2022-01-17 ENCOUNTER — Other Ambulatory Visit: Payer: Self-pay

## 2022-01-17 ENCOUNTER — Ambulatory Visit (INDEPENDENT_AMBULATORY_CARE_PROVIDER_SITE_OTHER): Payer: Medicare Other

## 2022-01-17 DIAGNOSIS — A539 Syphilis, unspecified: Secondary | ICD-10-CM | POA: Diagnosis not present

## 2022-01-17 MED ORDER — PENICILLIN G BENZATHINE 1200000 UNIT/2ML IM SUSY
1.2000 10*6.[IU] | PREFILLED_SYRINGE | Freq: Once | INTRAMUSCULAR | Status: AC
  Administered 2022-01-17: 1.2 10*6.[IU] via INTRAMUSCULAR

## 2022-01-25 IMAGING — MG DIGITAL SCREENING BILAT W/ TOMO W/ CAD
8 of 14 series · 8 of 40 positions shown · non-contrast
Comparison: Previous exam(s).

CLINICAL DATA: Screening.

EXAM:
DIGITAL SCREENING BILATERAL MAMMOGRAM WITH TOMO AND CAD

[L CC synth-2D (1 of 3)]
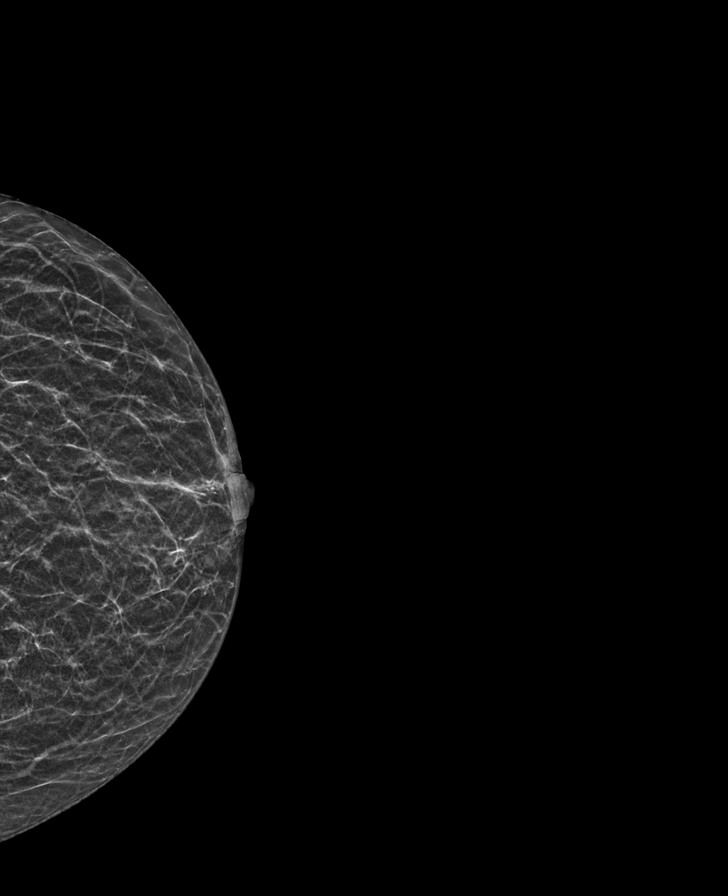

[L CC synth-2D (2 of 3)]
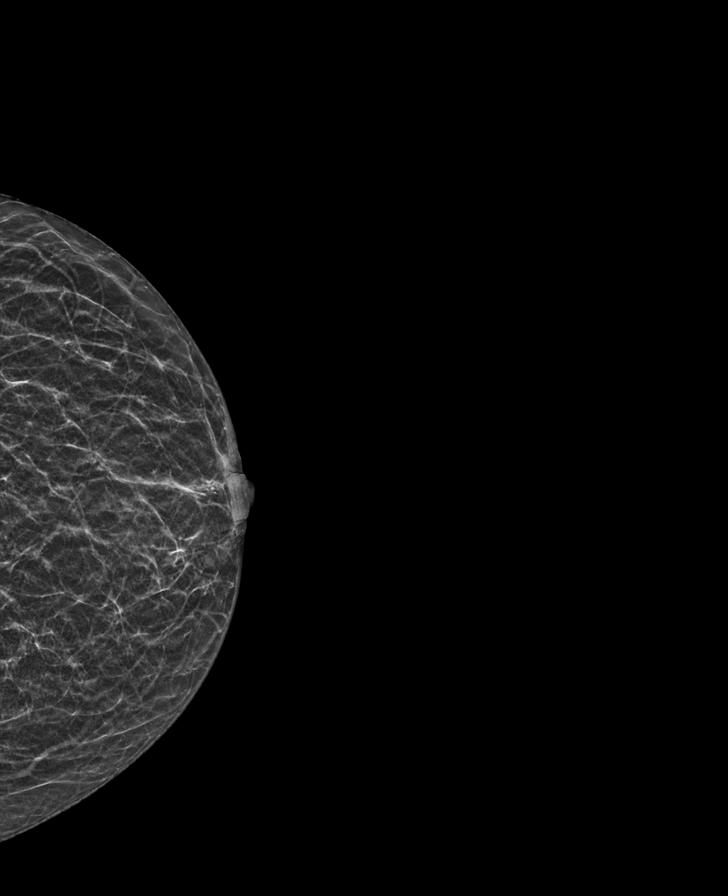

[R CC synth-2D (1 of 2)]
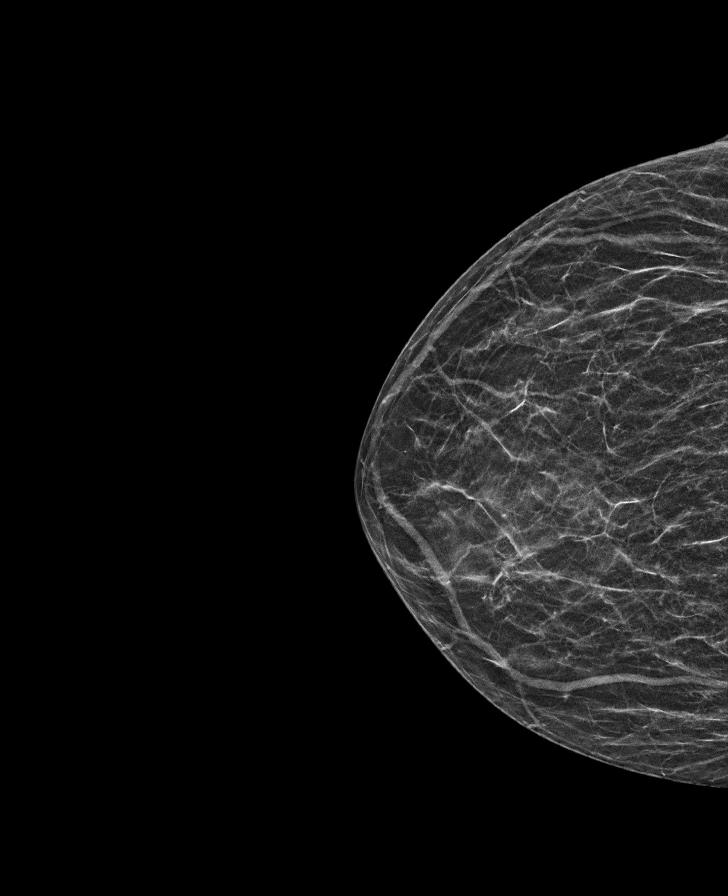

[L CC synth-2D (3 of 3)]
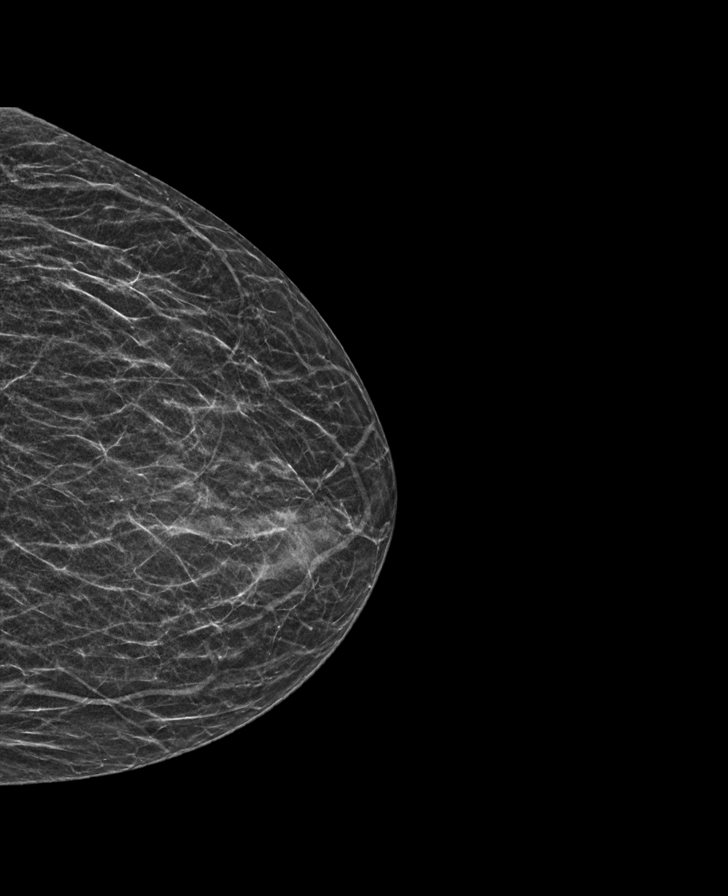

[L MLO synth-2D]
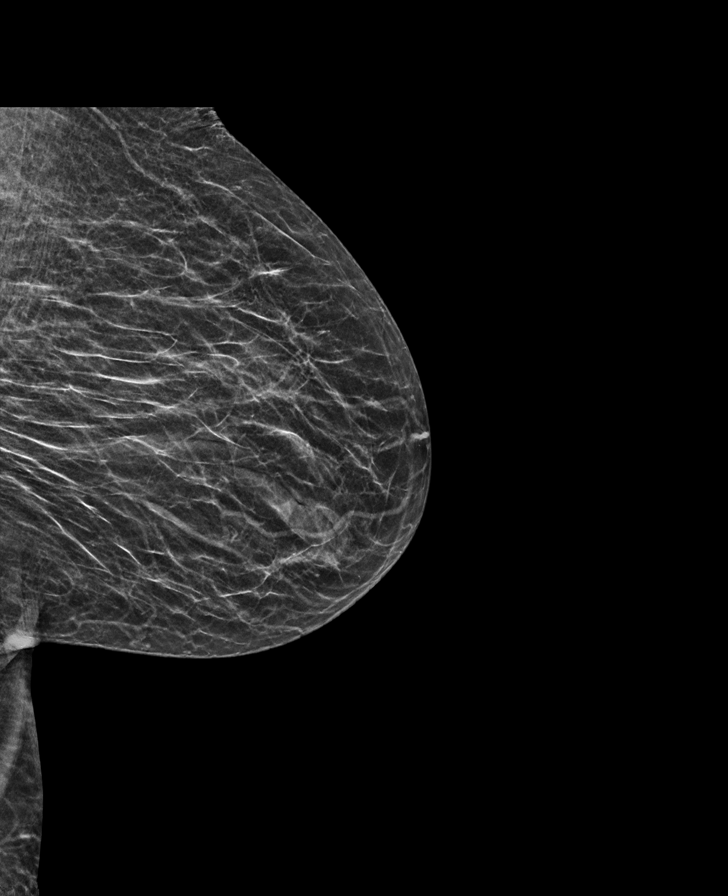

[R MLO synth-2D]
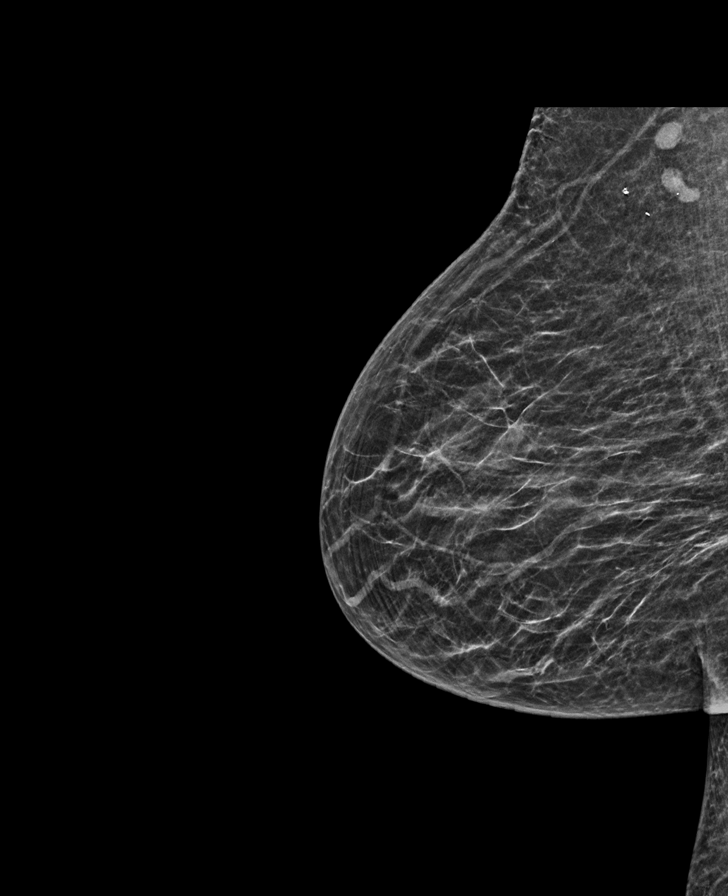

[R CC synth-2D (2 of 2)]
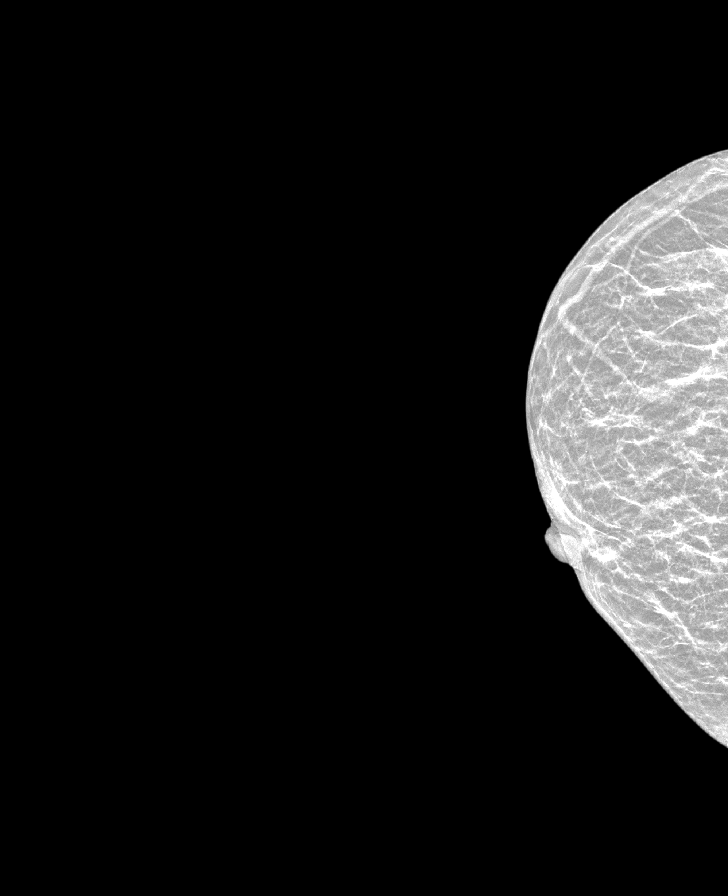

[L CC tomo · tomo slice 15/29.0]
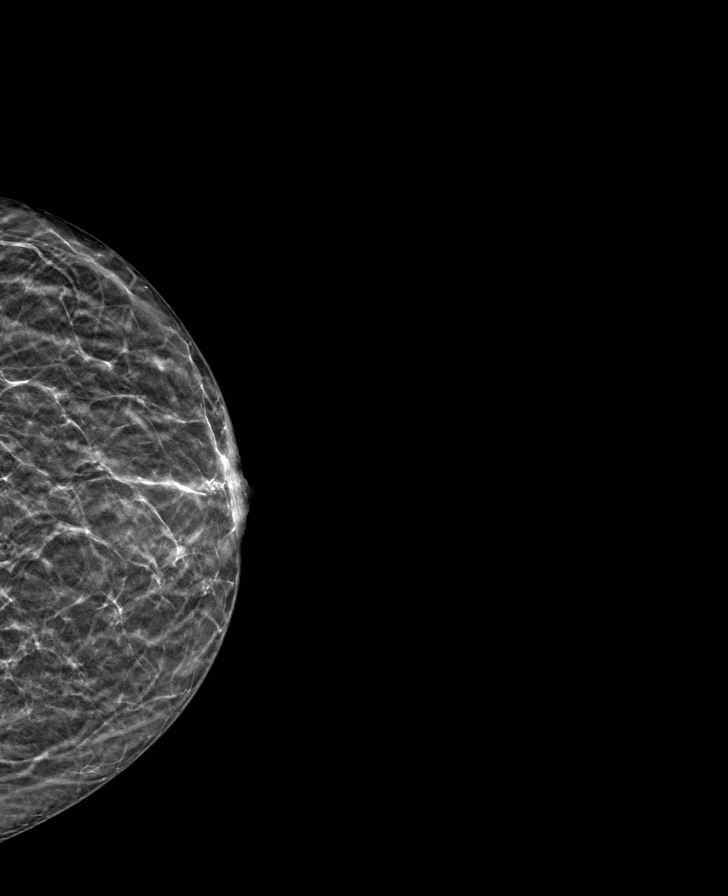

[8 of 40 positions shown; findings below may reference images not displayed]

ACR Breast Density Category b: There are scattered areas of
fibroglandular density.
FINDINGS: There are no findings suspicious for malignancy. Images were
processed with CAD.
IMPRESSION: No mammographic evidence of malignancy. A result letter of this
screening mammogram will be mailed directly to the patient.

RECOMMENDATION:
Screening mammogram in one year. (Code:[id number])

BI-RADS CATEGORY  1: Negative.

## 2022-02-18 ENCOUNTER — Telehealth: Payer: Self-pay

## 2022-02-18 NOTE — Telephone Encounter (Signed)
Agree with you Diminique!!

## 2022-02-18 NOTE — Telephone Encounter (Signed)
Patient daughter advised to reach out to pcp or dermatology for the patient  ?

## 2022-02-18 NOTE — Telephone Encounter (Signed)
Patient daughter called stating patient is still experiencing a rash, hives and itching since her last dose of penicillin. Patient's daughter feels patient's symptoms had improved while she was on weekly Bicillin for 3 weeks. I have advised the patient's daughter that she should reach out to patient's pcp and dermatologist to see what they suggest for the itching  ?

## 2022-02-26 ENCOUNTER — Other Ambulatory Visit: Payer: Self-pay

## 2022-02-26 ENCOUNTER — Encounter (INDEPENDENT_AMBULATORY_CARE_PROVIDER_SITE_OTHER): Payer: Medicare Other | Admitting: Ophthalmology

## 2022-02-26 DIAGNOSIS — E113393 Type 2 diabetes mellitus with moderate nonproliferative diabetic retinopathy without macular edema, bilateral: Secondary | ICD-10-CM | POA: Diagnosis not present

## 2022-02-26 DIAGNOSIS — H35033 Hypertensive retinopathy, bilateral: Secondary | ICD-10-CM | POA: Diagnosis not present

## 2022-02-26 DIAGNOSIS — H43813 Vitreous degeneration, bilateral: Secondary | ICD-10-CM

## 2022-02-26 DIAGNOSIS — I10 Essential (primary) hypertension: Secondary | ICD-10-CM

## 2022-02-26 DIAGNOSIS — H35371 Puckering of macula, right eye: Secondary | ICD-10-CM | POA: Diagnosis not present

## 2022-06-07 ENCOUNTER — Other Ambulatory Visit: Payer: Self-pay

## 2022-06-07 ENCOUNTER — Emergency Department (HOSPITAL_COMMUNITY)
Admission: EM | Admit: 2022-06-07 | Discharge: 2022-06-08 | Disposition: A | Discharge status: Home / Self Care | Payer: Medicare Other | Attending: Emergency Medicine | Admitting: Emergency Medicine

## 2022-06-07 ENCOUNTER — Emergency Department (HOSPITAL_COMMUNITY): Payer: Medicare Other

## 2022-06-07 DIAGNOSIS — R079 Chest pain, unspecified: Secondary | ICD-10-CM | POA: Diagnosis not present

## 2022-06-07 DIAGNOSIS — E119 Type 2 diabetes mellitus without complications: Secondary | ICD-10-CM | POA: Insufficient documentation

## 2022-06-07 DIAGNOSIS — R0602 Shortness of breath: Secondary | ICD-10-CM | POA: Diagnosis not present

## 2022-06-07 DIAGNOSIS — L299 Pruritus, unspecified: Secondary | ICD-10-CM | POA: Insufficient documentation

## 2022-06-07 DIAGNOSIS — R110 Nausea: Secondary | ICD-10-CM

## 2022-06-07 DIAGNOSIS — L810 Postinflammatory hyperpigmentation: Secondary | ICD-10-CM | POA: Insufficient documentation

## 2022-06-07 DIAGNOSIS — L309 Dermatitis, unspecified: Secondary | ICD-10-CM | POA: Insufficient documentation

## 2022-06-07 DIAGNOSIS — R112 Nausea with vomiting, unspecified: Secondary | ICD-10-CM | POA: Diagnosis present

## 2022-06-07 DIAGNOSIS — I10 Essential (primary) hypertension: Secondary | ICD-10-CM | POA: Insufficient documentation

## 2022-06-07 NOTE — ED Triage Notes (Addendum)
Patients chest began hurting at 9pm. Vomited, has lost her appetite today. Stated that the center of her chest caused her to lose her breath for a minute. Family history of heart attacks.

## 2022-06-07 NOTE — ED Provider Triage Note (Signed)
Emergency Medicine Provider Triage Evaluation Note  Keely Drennan , a 88 y.o. female  was evaluated in triage.  She has a history of dementia and history obtained from a family member.  Pt complains of feeling bad, vomiting.  Patient returned home this afternoon and had decreased appetite.  Family member noted some breathing difficulties tonight with episodes of vomiting.  They tried treating it with ginger tea and symptoms resumed, prompting emergency department visit.  No fevers.  Patient currently denies any pain, states "I just feel bad".  Review of Systems  Positive: Vomiting Negative: Fever Physical Exam  BP (!) 176/83 (BP Location: Right Arm)   Pulse 84   Temp 98.3 F (36.8 C) (Oral)   Resp 20   SpO2 100%  Gen:   Awake, no distress   Resp:  Normal effort  MSK:   Moves extremities without difficulty  Other:  Occasional ectopy, otherwise regular rhythm, abdomen is soft and nontender  Medical Decision Making  Medically screening exam initiated at 11:38 PM.  Appropriate orders placed.  Kandice Robinsons was informed that the remainder of the evaluation will be completed by another provider, this initial triage assessment does not replace that evaluation, and the importance of remaining in the ED until their evaluation is complete.     Carlisle Cater, PA-C 06/07/22 2340

## 2022-06-08 DIAGNOSIS — R112 Nausea with vomiting, unspecified: Secondary | ICD-10-CM | POA: Diagnosis not present

## 2022-06-08 LAB — COMPREHENSIVE METABOLIC PANEL
ALT: 10 U/L (ref 0–44)
AST: 12 U/L — ABNORMAL LOW (ref 15–41)
Albumin: 3.4 g/dL — ABNORMAL LOW (ref 3.5–5.0)
Alkaline Phosphatase: 79 U/L (ref 38–126)
Anion gap: 11 (ref 5–15)
BUN: 9 mg/dL (ref 8–23)
CO2: 23 mmol/L (ref 22–32)
Calcium: 9.9 mg/dL (ref 8.9–10.3)
Chloride: 101 mmol/L (ref 98–111)
Creatinine, Ser: 0.58 mg/dL (ref 0.44–1.00)
GFR, Estimated: >60 mL/min (ref >60)
Glucose, Bld: 237 mg/dL — ABNORMAL HIGH (ref 70–99)
Potassium: 4.3 mmol/L (ref 3.5–5.1)
Sodium: 135 mmol/L (ref 135–145)
Total Bilirubin: 0.8 mg/dL (ref 0.3–1.2)
Total Protein: 8.5 g/dL — ABNORMAL HIGH (ref 6.5–8.1)

## 2022-06-08 LAB — CBC WITH DIFFERENTIAL/PLATELET
Abs Immature Granulocytes: 0.07 10*3/uL (ref 0.00–0.07)
Basophils Absolute: 0 10*3/uL (ref 0.0–0.1)
Basophils Relative: 0 %
Eosinophils Absolute: 0 10*3/uL (ref 0.0–0.5)
Eosinophils Relative: 0 %
HCT: 44.8 % (ref 36.0–46.0)
Hemoglobin: 13.9 g/dL (ref 12.0–15.0)
Immature Granulocytes: 1 %
Lymphocytes Relative: 5 %
Lymphs Abs: 0.5 10*3/uL — ABNORMAL LOW (ref 0.7–4.0)
MCH: 24.4 pg — ABNORMAL LOW (ref 26.0–34.0)
MCHC: 31 g/dL (ref 30.0–36.0)
MCV: 78.6 fL — ABNORMAL LOW (ref 80.0–100.0)
Monocytes Absolute: 0.4 10*3/uL (ref 0.1–1.0)
Monocytes Relative: 4 %
Neutro Abs: 9.9 10*3/uL — ABNORMAL HIGH (ref 1.7–7.7)
Neutrophils Relative %: 90 %
Platelets: 342 10*3/uL (ref 150–400)
RBC: 5.7 MIL/uL — ABNORMAL HIGH (ref 3.87–5.11)
RDW: 15.6 % — ABNORMAL HIGH (ref 11.5–15.5)
WBC: 10.9 10*3/uL — ABNORMAL HIGH (ref 4.0–10.5)
nRBC: 0 % (ref 0.0–0.2)

## 2022-06-08 LAB — LIPASE, BLOOD: Lipase: 32 U/L (ref 11–51)

## 2022-06-08 LAB — TROPONIN I (HIGH SENSITIVITY)
Troponin I (High Sensitivity): 4 ng/L (ref <18)
Troponin I (High Sensitivity): 4 ng/L (ref <18)

## 2022-06-08 MED ORDER — ONDANSETRON 4 MG PO TBDP: 4.0000 mg | ORAL_TABLET | Freq: Three times a day (TID) | ORAL | 0 refills | Status: AC | PRN

## 2022-06-08 MED ORDER — ONDANSETRON HCL 4 MG/2ML IJ SOLN
4.0000 mg | Freq: Once | INTRAMUSCULAR | Status: AC
  Administered 2022-06-08: 4 mg via INTRAVENOUS
  Filled 2022-06-08: qty 2

## 2022-06-08 NOTE — ED Provider Notes (Signed)
[facility] Emergency Department Provider Note MRN:  [id number]  Arrival date & time: 06/08/22     Chief Complaint   Chest Pain   History of Present Illness   Carrie Patel is a 88 y.o. year-old female with a history of hypertension, diabetes presenting to the ED with chief complaint of chest pain.  Chest pain, vomiting this evening.  Was feeling nauseous and generally unwell.  Mild shortness of breath.  Review of Systems  A thorough review of systems was obtained and all systems are negative except as noted in the HPI and PMH.   Patient's Health History    Past Medical History:  Diagnosis Date   Arthritis    Diabetes mellitus without complication ([place])    Hypertension     Past Surgical History:  Procedure Laterality Date   KNEE SURGERY      Family History  Problem Relation Age of Onset   Diabetes Mother    Hypertension Mother    Diabetes Father    Hypertension Father     Social History   Socioeconomic History   Marital status: Single    Spouse name: Not on file   Number of children: Not on file   Years of education: Not on file   Highest education level: Not on file  Occupational History   Not on file  Tobacco Use   Smoking status: Never   Smokeless tobacco: Not on file  Substance and Sexual Activity   Alcohol use: No   Drug use: Not on file   Sexual activity: Not on file  Other Topics Concern   Not on file  Social History Narrative   Not on file   Social Determinants of Health   Financial Resource Strain: Not on file  Food Insecurity: Not on file  Transportation Needs: Not on file  Physical Activity: Not on file  Stress: Not on file  Social Connections: Not on file  Intimate Partner Violence: Not on file     Physical Exam   Vitals:   06/08/22 0330 06/08/22 0400  BP: (!) 191/93 140/74  Pulse: (!) 109 92  Resp: (!) 22 20  Temp:    SpO2: 99% 98%    CONSTITUTIONAL: Well-appearing, NAD NEURO/PSYCH:  Alert and  oriented x 3, no focal deficits EYES:  eyes equal and reactive ENT/NECK:  no LAD, no JVD CARDIO: Regular rate, well-perfused, normal S1 and S2 PULM:  CTAB no wheezing or rhonchi GI/GU:  non-distended, non-tender MSK/SPINE:  No gross deformities, no edema SKIN:  no rash, atraumatic   *Additional and/or pertinent findings included in MDM below  Diagnostic and Interventional Summary    EKG Interpretation  Date/Time:  Friday June 07 2022 23:53:52 EDT Ventricular Rate:  97 PR Interval:  195 QRS Duration: 95 QT Interval:  391 QTC Calculation: 497 R Axis:   -18 Text Interpretation: Sinus rhythm Ventricular premature complex Probable left atrial enlargement LVH with secondary repolarization abnormality Anterior Q waves, possibly due to LVH Confirmed by Gerlene Fee [phone]) on 06/08/2022 12:35:31 AM       Labs Reviewed  CBC WITH DIFFERENTIAL/PLATELET - Abnormal; Notable for the following components:      Result Value   WBC 10.9 (*)    RBC 5.70 (*)    MCV 78.6 (*)    MCH 24.4 (*)    RDW 15.6 (*)    Neutro Abs 9.9 (*)    Lymphs Abs 0.5 (*)    All other components within normal limits  COMPREHENSIVE METABOLIC PANEL - Abnormal; Notable for the following components:   Glucose, Bld 237 (*)    Total Protein 8.5 (*)    Albumin 3.4 (*)    AST 12 (*)    All other components within normal limits  LIPASE, BLOOD  TROPONIN I (HIGH SENSITIVITY)  TROPONIN I (HIGH SENSITIVITY)    DG Chest 2 View  Final Result      Medications  ondansetron (ZOFRAN) injection 4 mg (4 mg Intravenous Given 06/08/22 0043)     Procedures  /  Critical Care Procedures  ED Course and Medical Decision Making  Initial Impression and Ddx Brief episode of nausea, chest discomfort.  ACS considered, also indigestion, GERD.  Abdomen soft and nontender.  Awaiting troponin x2.  Past medical/surgical history that increases complexity of ED encounter: Hypertension, diabetes  Interpretation of Diagnostics I  personally reviewed the EKG and my interpretation is as follows: Sinus rhythm, no significant change from prior  Labs reassuring with no significant blood count or electrolyte disturbance, troponin negative x2.  X-ray normal.  Patient Reassessment and Ultimate Disposition/Management     On reassessment patient feeling much better, vital signs normal, abdomen continues to be soft and nontender, no indication for advanced abdominal imaging at this time.  Suspect GI etiology, indigestion, food poisoning, etc.  Appropriate for discharge with return precautions.  Patient management required discussion with the following services or consulting groups:  None  Complexity of Problems Addressed Acute illness or injury that poses threat of life of bodily function  Additional Data Reviewed and Analyzed Further history obtained from: Further history from spouse/family member  Additional Factors Impacting ED Encounter Risk Prescriptions and Consideration of hospitalization  Barth Kirks. Sedonia Small, [place] [email or web address]  Final Clinical Impressions(s) / ED Diagnoses     ICD-10-CM   1. Nausea  R11.0       ED Discharge Orders          Ordered    ondansetron (ZOFRAN-ODT) 4 MG disintegrating tablet  Every 8 hours PRN        06/08/22 0424             Discharge Instructions Discussed with and Provided to Patient:     Discharge Instructions      You were evaluated in the Emergency Department and after careful evaluation, we did not find any emergent condition requiring admission or further testing in the hospital.  Your exam/testing today was overall reassuring.  Symptoms may be due to some type of stomach bug or food poisoning.  Use the Zofran medication at home, plenty of fluids, rest.  Please return to the Emergency Department if you experience any worsening of your condition.  Thank you for allowing [place] to be a part of your  care.        Maudie Flakes, MD 06/08/22 [phone]

## 2022-06-08 NOTE — Discharge Instructions (Signed)
You were evaluated in the Emergency Department and after careful evaluation, we did not find any emergent condition requiring admission or further testing in the hospital.  Your exam/testing today was overall reassuring.  Symptoms may be due to some type of stomach bug or food poisoning.  Use the Zofran medication at home, plenty of fluids, rest.  Please return to the Emergency Department if you experience any worsening of your condition.  Thank you for allowing [place] to be a part of your care.

## 2022-06-18 ENCOUNTER — Other Ambulatory Visit: Payer: Self-pay | Admitting: Internal Medicine

## 2022-06-18 DIAGNOSIS — R110 Nausea: Secondary | ICD-10-CM

## 2022-06-18 DIAGNOSIS — K219 Gastro-esophageal reflux disease without esophagitis: Secondary | ICD-10-CM

## 2022-06-21 ENCOUNTER — Other Ambulatory Visit: Payer: Medicare Other

## 2022-06-25 ENCOUNTER — Ambulatory Visit
Admission: RE | Admit: 2022-06-25 | Discharge: 2022-06-25 | Disposition: A | Discharge status: Home / Self Care | Payer: Medicare Other | Source: Ambulatory Visit | Attending: Internal Medicine | Admitting: Internal Medicine

## 2022-06-25 ENCOUNTER — Other Ambulatory Visit: Payer: Self-pay | Admitting: Internal Medicine

## 2022-06-25 DIAGNOSIS — R110 Nausea: Secondary | ICD-10-CM

## 2022-06-25 DIAGNOSIS — K219 Gastro-esophageal reflux disease without esophagitis: Secondary | ICD-10-CM

## 2022-07-23 DIAGNOSIS — L304 Erythema intertrigo: Secondary | ICD-10-CM | POA: Insufficient documentation

## 2022-08-27 ENCOUNTER — Encounter (INDEPENDENT_AMBULATORY_CARE_PROVIDER_SITE_OTHER): Payer: Medicare Other | Admitting: Ophthalmology

## 2022-08-27 DIAGNOSIS — E113311 Type 2 diabetes mellitus with moderate nonproliferative diabetic retinopathy with macular edema, right eye: Secondary | ICD-10-CM

## 2022-08-27 DIAGNOSIS — E113392 Type 2 diabetes mellitus with moderate nonproliferative diabetic retinopathy without macular edema, left eye: Secondary | ICD-10-CM | POA: Diagnosis not present

## 2022-08-27 DIAGNOSIS — I10 Essential (primary) hypertension: Secondary | ICD-10-CM | POA: Diagnosis not present

## 2022-08-27 DIAGNOSIS — H35033 Hypertensive retinopathy, bilateral: Secondary | ICD-10-CM | POA: Diagnosis not present

## 2022-08-27 DIAGNOSIS — H43813 Vitreous degeneration, bilateral: Secondary | ICD-10-CM

## 2023-01-15 ENCOUNTER — Ambulatory Visit (INDEPENDENT_AMBULATORY_CARE_PROVIDER_SITE_OTHER): Payer: 59 | Admitting: Infectious Diseases

## 2023-01-15 ENCOUNTER — Other Ambulatory Visit: Payer: Self-pay

## 2023-01-15 ENCOUNTER — Telehealth: Payer: Self-pay

## 2023-01-15 VITALS — BP 150/77 | HR 54 | Temp 98.0°F

## 2023-01-15 DIAGNOSIS — R21 Rash and other nonspecific skin eruption: Secondary | ICD-10-CM

## 2023-01-15 DIAGNOSIS — A539 Syphilis, unspecified: Secondary | ICD-10-CM

## 2023-01-15 DIAGNOSIS — M069 Rheumatoid arthritis, unspecified: Secondary | ICD-10-CM

## 2023-01-15 NOTE — Progress Notes (Signed)
Patient Active Problem List   Diagnosis Date Noted   Syphilis 01/06/2022   Rash and other nonspecific skin eruption 01/06/2022   Rheumatoid arthritis ([place]) 01/06/2022   Diabetes ([place]) 10/24/2021   Subjective: 89 YO female with PMH of DM, HTN, RA on weekly methotrexate who is referred for positive syphilis serology. Daughter serves as a Scientist, physiological. Outside medical records and EMR reviewed.  Accompanied by daughter. She started breaking out in March 2022 and says has seen every doctor in [place] including Endocrinology, Allergy and Dermatology. Rashes are associated with itching and she wakes up at night. Does not seem to be painful. Denies use of any new medications, lotions etc that could be related. She has also used ketoconazole shampoo and has used permethrin cream but nothing seems to help. Seen in the UC  12/27/21 and has asked to do full panel of blood test where she was found to have positive for syphilis. She is currently taking doxycyline - D 7. Patient has Vascular dementia for last 10 years. She was treated for syphilis 30 years ago with possibly 3 shots of penicillin ( daughter is sure its more than 1). Her husband also expired 30 years ago and daughter thinks last time she was sexually active was 30 years ago. Daughter denies any reported symptoms of headache, blurry vision, neck pain, new weakness/tingling.   They also follow [facility] Dermat[facility] Dr Lorenda Ishihara [place] where skin biopsy was done ( unsure about results). Has also used short courses of prednisone in the past  ( March and August 2022) which helps. She wakes up at night and starts to itch. Daughter applies home made cream with baking soda and baby oat milk to calm her down.   Follows Dr Dossie Der for RA and is on weekly Methotrexate.   12/27/21 RPR 1:2, Reactive, FTA Abs reactive  HIV ag/ab 4thgen NR Acute hepatitis panel NR UA unremarkable   01/15/23 After last seen by me in 12/2021, she has been followed by  Dermatology for her skin conditions. Last office note 8/15 where she was prescribed hydroxyzine, nystatin. Daughter reports she was doing well until when she broke out 2-3 weeks ago mostly in her bilateral sides and lower back and bilateral upper extremities and daughter thinks it could be related to fluctuating BG levels. No new medications use except Vit D. No new soap, perfumes or clothes. Daughter keeps her room clean and hygienic and tells unlikely to have insect bite or bed bug bites. She has cleaned her room/bed several times. Seen by [facility] D[facility] Monday where she had biopsy done. Saw endocrinology where she had RPR done and was told to be abnormal and asked to fu with ID. Daughter also reports she has been taken off of methotrexate for RA and is not currently taking anything for RA.   Daughter otherwise do not report any other complaints. Denies fevers, chills. Denies chest pain, cough and SOB Denies nausea, vomiting and diarrhea. She has urinary incontinence. Appetite is good.   Review of Systems: ROS patient is demented   Past Medical History:  Diagnosis Date   Arthritis    Diabetes mellitus without complication ([place])    Hypertension    Past Surgical History:  Procedure Laterality Date   KNEE SURGERY     Social History   Tobacco Use   Smoking status: Never  Substance Use Topics   Alcohol use: No    Family History  Problem Relation Age of Onset   Diabetes  Mother    Hypertension Mother    Diabetes Father    Hypertension Father     No Known Allergies  Health Maintenance  Topic Date Due   HEMOGLOBIN A1C  Never done   COVID-19 Vaccine (1) Never done   OPHTHALMOLOGY EXAM  Never done   DTaP/Tdap/Td (1 - Tdap) Never done   Zoster Vaccines- Shingrix (1 of 2) Never done   Pneumonia Vaccine 31+ Years old (1 - PCV) Never done   DEXA SCAN  Never done   Medicare Annual Wellness (AWV)  03/08/2022   INFLUENZA VACCINE  07/09/2022   FOOT EXAM  10/22/2022   HPV  VACCINES  Aged Out    Objective: BP (!) 150/77   Pulse (!) 54   Temp 98 F (36.7 C) (Oral)   SpO2 98%    Physical Exam Constitutional:      Appearance: Normal appearance. Elderly female  HENT:     Head: Normocephalic and atraumatic.      Mouth: Mucous membranes are moist, no ulcers Eyes:    Conjunctiva/sclera: Conjunctivae normal.     Pupils: Pupils are equal, round  Cardiovascular:     Rate and Rhythm: Normal rate and regular rhythm.     Heart sounds:   Pulmonary:     Effort: Pulmonary effort is normal.     Breath sounds: Normal breath sounds.   Abdominal:     General: Abdomen is flat.     Palpations: Abdomen is soft.   Musculoskeletal:        General: in a wheel chair, contracted rt hand  Skin:    General: Skin is warm and dry.     Comments: scattered chronic appearing papular with dark centered rashes in the bilateral sides and lower back, scattered small erythematous papular lesions in the upper extremities, No rashes in the lower extremities or oral cavity.             Neurological:     General: on a wheel chair    Mental Status: h/o dementia and follows commands   Lab Results Lab Results  Component Value Date   WBC 10.9 (H) 06/08/2022   HGB 13.9 06/08/2022   HCT 44.8 06/08/2022   MCV 78.6 (L) 06/08/2022   PLT 342 06/08/2022    Lab Results  Component Value Date   CREATININE 0.58 06/08/2022   BUN 9 06/08/2022   NA 135 06/08/2022   K 4.3 06/08/2022   CL 101 06/08/2022   CO2 23 06/08/2022    Lab Results  Component Value Date   ALT 10 06/08/2022   AST 12 (L) 06/08/2022   ALKPHOS 79 06/08/2022   BILITOT 0.8 06/08/2022    No results found for: "CHOL", "HDL", "LDLCALC", "LDLDIRECT", "TRIG", "CHOLHDL" Lab Results  Component Value Date   LABRPR REACTIVE (A) 01/04/2022   RPRTITER 1:2 (H) 01/04/2022   No results found for: "HIV1RNAQUANT", "HIV1RNAVL", "CD4TABS"   Assessment/Plan 89 Y O female with h/o RA off meds, HTN and DM with  previous h/o treated syphilis ( treated with 3 weekly Benzathine Pen G in 12/2021) here with discussions about recent abnormal RPR results ( unavailable to me currently) and recent break out of rashes   # Generalized rashes involving lower back, bilateral sides ( chronic appearing papular) as well as scattered erythematous in the upper extremities  Will obtain results of RPR from Endocrinology office but expect it will be abnormal at low titres given her h/o treated syphilis, possibly serofast state.  Unlikely  to have a re-infection given no new sexual exposure. Less likely rashes are due to syphilis.   She is already following Dermatology and had biopsy done Monday, will request results Fu with PCP for management of BG and DM and rheumatology for RA Fu in a month or as needed   I have personally spent 43  minutes involved in face-to-face and non-face-to-face activities for this patient on the day of the visit. Professional time spent includes the following activities: Preparing to see the patient (review of tests), Obtaining and/or reviewing separately obtained history (admission/discharge record), Performing a medically appropriate examination and/or evaluation , Ordering medications/tests/procedures, referring and communicating with other health care professionals, Documenting clinical information in the EMR, Independently interpreting results (not separately reported), Communicating results to the patient/family/caregiver, Counseling and educating the patient/family/caregiver and Care coordination (not separately reported).   Wilber Oliphant, [facility] 01/15/2023, 8:41 AM

## 2023-01-15 NOTE — Telephone Encounter (Signed)
Per Dr. West Bali called Dr. Creig Hines office to request patient last RPR and confirmatory labs. Left voicemail with Luetta Nutting, [place] requesting results be faxed to office. Leatrice Jewels, RMA

## 2023-01-17 NOTE — Telephone Encounter (Signed)
Per MD called [facility] to have patient's last RPR faxed to office. Left voicemail requesting call back.  Medical released faxed Wednesday. Received office note but no RPR.  Leatrice Jewels, RMA

## 2023-01-21 DIAGNOSIS — M25552 Pain in left hip: Secondary | ICD-10-CM | POA: Insufficient documentation

## 2023-01-21 DIAGNOSIS — M25562 Pain in left knee: Secondary | ICD-10-CM | POA: Insufficient documentation

## 2023-01-25 DIAGNOSIS — L509 Urticaria, unspecified: Secondary | ICD-10-CM | POA: Insufficient documentation

## 2023-01-25 DIAGNOSIS — T07XXXA Unspecified multiple injuries, initial encounter: Secondary | ICD-10-CM | POA: Insufficient documentation

## 2023-02-18 ENCOUNTER — Ambulatory Visit: Payer: 59 | Admitting: Infectious Diseases

## 2023-02-25 ENCOUNTER — Encounter (INDEPENDENT_AMBULATORY_CARE_PROVIDER_SITE_OTHER): Payer: 59 | Admitting: Ophthalmology

## 2023-02-25 DIAGNOSIS — E113393 Type 2 diabetes mellitus with moderate nonproliferative diabetic retinopathy without macular edema, bilateral: Secondary | ICD-10-CM | POA: Diagnosis not present

## 2023-02-25 DIAGNOSIS — I10 Essential (primary) hypertension: Secondary | ICD-10-CM

## 2023-02-25 DIAGNOSIS — H35371 Puckering of macula, right eye: Secondary | ICD-10-CM

## 2023-02-25 DIAGNOSIS — H35033 Hypertensive retinopathy, bilateral: Secondary | ICD-10-CM | POA: Diagnosis not present

## 2023-02-25 DIAGNOSIS — H43813 Vitreous degeneration, bilateral: Secondary | ICD-10-CM

## 2023-03-04 DIAGNOSIS — M1711 Unilateral primary osteoarthritis, right knee: Secondary | ICD-10-CM | POA: Insufficient documentation

## 2023-04-07 DIAGNOSIS — L501 Idiopathic urticaria: Secondary | ICD-10-CM | POA: Insufficient documentation

## 2023-04-28 ENCOUNTER — Emergency Department (HOSPITAL_COMMUNITY)
Admission: EM | Admit: 2023-04-28 | Discharge: 2023-04-28 | Disposition: A | Discharge status: Home / Self Care | Payer: 59 | Attending: Emergency Medicine | Admitting: Emergency Medicine

## 2023-04-28 ENCOUNTER — Emergency Department (HOSPITAL_COMMUNITY): Payer: 59

## 2023-04-28 ENCOUNTER — Encounter (HOSPITAL_COMMUNITY): Payer: Self-pay

## 2023-04-28 ENCOUNTER — Other Ambulatory Visit: Payer: Self-pay

## 2023-04-28 DIAGNOSIS — Z7984 Long term (current) use of oral hypoglycemic drugs: Secondary | ICD-10-CM | POA: Diagnosis not present

## 2023-04-28 DIAGNOSIS — Z043 Encounter for examination and observation following other accident: Secondary | ICD-10-CM | POA: Diagnosis not present

## 2023-04-28 DIAGNOSIS — E119 Type 2 diabetes mellitus without complications: Secondary | ICD-10-CM | POA: Insufficient documentation

## 2023-04-28 DIAGNOSIS — S0990XA Unspecified injury of head, initial encounter: Secondary | ICD-10-CM | POA: Diagnosis not present

## 2023-04-28 DIAGNOSIS — R42 Dizziness and giddiness: Secondary | ICD-10-CM | POA: Diagnosis not present

## 2023-04-28 DIAGNOSIS — S60512A Abrasion of left hand, initial encounter: Secondary | ICD-10-CM | POA: Diagnosis not present

## 2023-04-28 DIAGNOSIS — S0083XA Contusion of other part of head, initial encounter: Secondary | ICD-10-CM | POA: Diagnosis not present

## 2023-04-28 DIAGNOSIS — Z794 Long term (current) use of insulin: Secondary | ICD-10-CM | POA: Diagnosis not present

## 2023-04-28 DIAGNOSIS — S4992XA Unspecified injury of left shoulder and upper arm, initial encounter: Secondary | ICD-10-CM

## 2023-04-28 DIAGNOSIS — I10 Essential (primary) hypertension: Secondary | ICD-10-CM | POA: Diagnosis not present

## 2023-04-28 DIAGNOSIS — W01198A Fall on same level from slipping, tripping and stumbling with subsequent striking against other object, initial encounter: Secondary | ICD-10-CM | POA: Insufficient documentation

## 2023-04-28 DIAGNOSIS — S0993XA Unspecified injury of face, initial encounter: Secondary | ICD-10-CM | POA: Diagnosis present

## 2023-04-28 DIAGNOSIS — S40012A Contusion of left shoulder, initial encounter: Secondary | ICD-10-CM | POA: Diagnosis not present

## 2023-04-28 NOTE — ED Triage Notes (Signed)
C/o mechanical fall hitting head with swelling to left eye, blurred vision, and dizziness.  Denies blood thinner usage.  Denies LOC

## 2023-04-28 NOTE — ED Provider Notes (Signed)
[place] EMERGENCY DEPARTMENT AT [facility] Provider Note   CSN: [id number] Arrival date & time: 04/28/23  1030     History  Chief Complaint  Patient presents with   Carrie Patel is a 89 y.o. female.   Fall     Patient presents ED for evaluation after a fall.  Patient has a history of diabetes, hypertension, rheumatoid arthritis.  Patient was walking today to an adult daycare center.  She ended up tripping over carpet and fell hitting the left side of her head.  Patient complained of some bruising and swelling around her eye.  Patient denies feeling weak or lightheaded.  She states she was able to get up and walk with some assistance.  Family is concerned about the head injury.  They have noticed some swelling around her left eye after the injury.  Patient is not on anticoagulation.  She is not having any pain in her legs.  No chest pain no abdominal pain.  No vomiting.  No confusion.  Home Medications Prior to Admission medications   Medication Sig Start Date End Date Taking? Authorizing Provider  augmented betamethasone dipropionate (DIPROLENE-AF) 0.05 % ointment Apply 1 Application topically daily. 05/14/22   [provider]  HUMALOG KWIKPEN 100 UNIT/ML KwikPen Inject into the skin daily. Sliding scale after eating 06/06/22   [provider]  hydrALAZINE (APRESOLINE) 50 MG tablet Take 50 mg by mouth 2 (two) times daily.     [provider]  hydrochlorothiazide (HYDRODIURIL) 25 MG tablet Take 25 mg by mouth daily.    [provider]  hydrOXYzine (ATARAX) 10 MG tablet Take 10 mg by mouth at bedtime. 06/04/22   [provider]  insulin degludec (TRESIBA FLEXTOUCH) 100 UNIT/ML FlexTouch Pen Inject 16 Units into the skin daily. 05/08/21   [provider]  losartan (COZAAR) 50 MG tablet Take 50 mg by mouth daily.    [provider]  metFORMIN (GLUCOPHAGE-XR) 500 MG 24 hr tablet Take 500 mg by mouth daily  with breakfast. 03/18/22   [provider]  metoprolol succinate (TOPROL-XL) 100 MG 24 hr tablet Take 100 mg by mouth daily. Take with or immediately following a meal.    [provider]  ondansetron (ZOFRAN-ODT) 4 MG disintegrating tablet Take 1 tablet (4 mg total) by mouth every 8 (eight) hours as needed for nausea or vomiting. 06/08/22   Sabas Sous, MD  pravastatin (PRAVACHOL) 80 MG tablet Take 80 mg by mouth daily.    [provider]  triamcinolone cream (KENALOG) 0.1 % Apply topically 2 (two) times daily. 06/04/22   [provider]  Vitamin D, Ergocalciferol, (DRISDOL) 1.25 MG (50000 UNIT) CAPS capsule Take 50,000 Units by mouth once a week. 04/16/22   [provider]  Vitamin D, Ergocalciferol, (DRISDOL) 1.25 MG (50000 UNIT) CAPS capsule 1 capsule Orally once weekly for 90 days 04/16/22   [provider]      Allergies    Patient has no known allergies.    Review of Systems   Review of Systems  Physical Exam Updated Vital Signs BP (!) 178/94   Pulse 60   Temp 98.2 F (36.8 C) (Oral)   Resp 16   Wt 74 kg   SpO2 100%   BMI 27.15 kg/m  Physical Exam Vitals and nursing note reviewed.  Constitutional:      Appearance: She is well-developed. She is not diaphoretic.     Comments: Elderly, frail  HENT:     Head: Normocephalic.     Comments: Mild erythema noted around left eye,    Right Ear: External ear normal.     Left Ear: External ear normal.  Eyes:     General: No scleral icterus.       Right eye: No discharge.        Left eye: No discharge.     Conjunctiva/sclera: Conjunctivae normal.     Comments: No subconjunctival hemorrhage, extraocular movements intact  Neck:     Trachea: No tracheal deviation.  Cardiovascular:     Rate and Rhythm: Normal rate and regular rhythm.  Pulmonary:     Effort: Pulmonary effort is normal. No respiratory distress.     Breath sounds: Normal breath sounds. No stridor. No wheezing or  rales.  Abdominal:     General: Bowel sounds are normal. There is no distension.     Palpations: Abdomen is soft.     Tenderness: There is no abdominal tenderness. There is no guarding or rebound.  Musculoskeletal:        General: No tenderness or deformity.     Cervical back: Normal and neck supple.     Thoracic back: Normal.     Lumbar back: Normal.     Comments: No tenderness to palpation right upper extremity, right lower extremity or left lower extremity, full range of motion, small abrasion noted to the metacarpal phalangeal region of her left hand, no focal tenderness, tenderness palpation left wrist, mild pain with range of motion of the left shoulder  Skin:    General: Skin is warm and dry.     Findings: No rash.  Neurological:     General: No focal deficit present.     Mental Status: She is alert.     Cranial Nerves: No cranial nerve deficit, dysarthria or facial asymmetry.     Sensory: No sensory deficit.     Motor: No abnormal muscle tone or seizure activity.     Coordination: Coordination normal.  Psychiatric:        Mood and Affect: Mood normal.     ED Results / Procedures / Treatments   Labs (all labs ordered are listed, but only abnormal results are displayed) Labs Reviewed - No data to display  EKG EKG Interpretation  Date/Time:  Monday Apr 28 2023 10:41:21 EDT Ventricular Rate:  60 PR Interval:  176 QRS Duration: 98 QT Interval:  452 QTC Calculation: 452 R Axis:   -31 Text Interpretation: Sinus rhythm Left ventricular hypertrophy Anterior Q waves, possibly due to LVH Since last tracing rate slower Confirmed by Linwood Dibbles [phone]) on 04/28/2023 11:05:30 AM  Radiology CT Head Wo Contrast  Result Date: 04/28/2023 CLINICAL DATA:  Patient fell and struck head. Swelling to left eye with blurry vision and dizziness. EXAM: CT HEAD WITHOUT CONTRAST CT CERVICAL SPINE WITHOUT CONTRAST TECHNIQUE: Multidetector CT imaging of the head and cervical spine was performed  following the standard protocol without intravenous contrast. Multiplanar CT image reconstructions of the cervical spine were also generated. RADIATION DOSE REDUCTION: This exam was performed according to the departmental dose-optimization program which includes automated exposure control, adjustment of the mA and/or kV according to patient size and/or use of iterative reconstruction technique. COMPARISON:  Head CT 10/03/2021 FINDINGS: CT HEAD FINDINGS Brain: There is no evidence for acute hemorrhage, hydrocephalus, mass lesion, or abnormal extra-axial fluid collection. No definite CT evidence for acute infarction. Diffuse loss of parenchymal volume is consistent with atrophy. Patchy low  attenuation in the deep hemispheric and periventricular white matter is nonspecific, but likely reflects chronic microvascular ischemic demyelination. Vascular: No hyperdense vessel or unexpected calcification. Skull: No evidence for fracture. No worrisome lytic or sclerotic lesion. Sinuses/Orbits: Visualized paranasal sinuses are clear. Right mastoid air cells are clear. Left mastoid effusion is new in the interval. Visualized portions of the globes and intraorbital fat are unremarkable. Other: None. CT CERVICAL SPINE FINDINGS Alignment: Normal. Skull base and vertebrae: No acute fracture. No primary bone lesion or focal pathologic process. Soft tissues and spinal canal: No prevertebral fluid or swelling. No visible canal hematoma. Disc levels: Loss of disc height noted C3-4, C4-5, and C5-6. Facets are well aligned bilaterally. Upper chest: Proximal esophagus appears patulous. Other: None. IMPRESSION: 1. No acute intracranial abnormality. 2. Atrophy with chronic small vessel ischemic disease. 3. Left mastoid effusion, new in the interval. 4. No evidence for cervical spine fracture or subluxation. 5. Degenerative changes in the cervical spine as above. Electronically Signed   By: Kennith Center M.D.   On: 04/28/2023 12:02   CT  Cervical Spine Wo Contrast  Result Date: 04/28/2023 CLINICAL DATA:  Patient fell and struck head. Swelling to left eye with blurry vision and dizziness. EXAM: CT HEAD WITHOUT CONTRAST CT CERVICAL SPINE WITHOUT CONTRAST TECHNIQUE: Multidetector CT imaging of the head and cervical spine was performed following the standard protocol without intravenous contrast. Multiplanar CT image reconstructions of the cervical spine were also generated. RADIATION DOSE REDUCTION: This exam was performed according to the departmental dose-optimization program which includes automated exposure control, adjustment of the mA and/or kV according to patient size and/or use of iterative reconstruction technique. COMPARISON:  Head CT 10/03/2021 FINDINGS: CT HEAD FINDINGS Brain: There is no evidence for acute hemorrhage, hydrocephalus, mass lesion, or abnormal extra-axial fluid collection. No definite CT evidence for acute infarction. Diffuse loss of parenchymal volume is consistent with atrophy. Patchy low attenuation in the deep hemispheric and periventricular white matter is nonspecific, but likely reflects chronic microvascular ischemic demyelination. Vascular: No hyperdense vessel or unexpected calcification. Skull: No evidence for fracture. No worrisome lytic or sclerotic lesion. Sinuses/Orbits: Visualized paranasal sinuses are clear. Right mastoid air cells are clear. Left mastoid effusion is new in the interval. Visualized portions of the globes and intraorbital fat are unremarkable. Other: None. CT CERVICAL SPINE FINDINGS Alignment: Normal. Skull base and vertebrae: No acute fracture. No primary bone lesion or focal pathologic process. Soft tissues and spinal canal: No prevertebral fluid or swelling. No visible canal hematoma. Disc levels: Loss of disc height noted C3-4, C4-5, and C5-6. Facets are well aligned bilaterally. Upper chest: Proximal esophagus appears patulous. Other: None. IMPRESSION: 1. No acute intracranial  abnormality. 2. Atrophy with chronic small vessel ischemic disease. 3. Left mastoid effusion, new in the interval. 4. No evidence for cervical spine fracture or subluxation. 5. Degenerative changes in the cervical spine as above. Electronically Signed   By: Kennith Center M.D.   On: 04/28/2023 12:02   DG Shoulder Left  Result Date: 04/28/2023 CLINICAL DATA:  Mechanical fall. EXAM: LEFT SHOULDER - 2+ VIEW COMPARISON:  None Available. FINDINGS: There is no evidence of fracture or dislocation. Moderate to severe osteoarthritic changes with remodeling of the left humeral head. Soft tissues are normal. IMPRESSION: No definite evidence of fracture. Moderate to severe osteoarthritic changes with remodeling of the left humeral head. Please note that subtle subchondral fractures are easily overlooked in the settings of osteoarthritic changes. If patient's symptoms persist, consider cross-sectional imaging. Electronically Signed  By: Ted Mcalpine M.D.   On: 04/28/2023 11:41    Procedures Procedures    Medications Ordered in ED Medications - No data to display  ED Course/ Medical Decision Making/ A&P Clinical Course as of 04/28/23 1307  Mon Apr 28, 2023  1257 Head CT does not show any evidence of cervical spine fracture facial bone fracture or intracranial bleeding [JK]  1258 Shoulder x-ray shows changes of arthritis.  Subtle subchondral fractures could be missed [JK]    Clinical Course User Index [JK] Linwood Dibbles, MD                             Medical Decision Making Differential diagnosis includes but not limited to closed head injury, facial bone fracture, cervical spine fracture  Problems Addressed: Contusion of face, initial encounter: acute illness or injury that poses a threat to life or bodily functions Injury of left shoulder, initial encounter: acute illness or injury that poses a threat to life or bodily functions  Amount and/or Complexity of Data Reviewed Radiology: ordered  and independent interpretation performed.   Patient presented to the ED for evaluation after fall.  Patient had mechanical fall.  No prodromal symptoms to suggest syncopal episode.  Was concerned about the possibility of serious head injury considering her age and comorbidities.  Fortunately CT scan does not show any worrisome findings.  Patient was having some shoulder tenderness.  She has rheumatoid arthritis and has diffuse joint pain.  X-ray does not show any definite fracture but it is possible to have a subtle fracture that does not show up on plain films.  I discussed this with the patient and her daughter.  They are comfortable with keeping an eye on it and following up with her doctor to be rechecked if the symptoms do not improve.  I do not think the patient requires CT scanning of her shoulder at this time.        Final Clinical Impression(s) / ED Diagnoses Final diagnoses:  Contusion of face, initial encounter  Injury of left shoulder, initial encounter    Rx / DC Orders ED Discharge Orders     None         Linwood Dibbles, MD 04/28/23 1308

## 2023-04-28 NOTE — Discharge Instructions (Signed)
The head CT and cervical spine CT did not show any signs of serious injury.  The x-ray of the shoulder did not show a definite fracture but it is possible to have a hairline fracture will be missed because of the underlying arthritis.  Follow-up with your primary care doctor or orthopedic doctor as we discussed to be rechecked if the shoulder pain is not improving in the next week or 2.

## 2023-05-01 DIAGNOSIS — E785 Hyperlipidemia, unspecified: Secondary | ICD-10-CM | POA: Diagnosis not present

## 2023-05-01 DIAGNOSIS — L508 Other urticaria: Secondary | ICD-10-CM | POA: Diagnosis not present

## 2023-05-01 DIAGNOSIS — E1169 Type 2 diabetes mellitus with other specified complication: Secondary | ICD-10-CM | POA: Diagnosis not present

## 2023-05-01 DIAGNOSIS — R634 Abnormal weight loss: Secondary | ICD-10-CM | POA: Diagnosis not present

## 2023-05-01 DIAGNOSIS — L433 Subacute (active) lichen planus: Secondary | ICD-10-CM | POA: Diagnosis not present

## 2023-05-01 DIAGNOSIS — I10 Essential (primary) hypertension: Secondary | ICD-10-CM | POA: Diagnosis not present

## 2023-05-01 DIAGNOSIS — R195 Other fecal abnormalities: Secondary | ICD-10-CM | POA: Diagnosis not present

## 2023-05-01 DIAGNOSIS — L89152 Pressure ulcer of sacral region, stage 2: Secondary | ICD-10-CM | POA: Diagnosis not present

## 2023-05-06 DIAGNOSIS — G309 Alzheimer's disease, unspecified: Secondary | ICD-10-CM | POA: Diagnosis not present

## 2023-05-06 DIAGNOSIS — E1165 Type 2 diabetes mellitus with hyperglycemia: Secondary | ICD-10-CM | POA: Diagnosis not present

## 2023-05-06 DIAGNOSIS — Z96652 Presence of left artificial knee joint: Secondary | ICD-10-CM | POA: Diagnosis not present

## 2023-05-15 DIAGNOSIS — L298 Other pruritus: Secondary | ICD-10-CM | POA: Diagnosis not present

## 2023-05-15 DIAGNOSIS — L438 Other lichen planus: Secondary | ICD-10-CM | POA: Diagnosis not present

## 2023-06-06 DIAGNOSIS — E1165 Type 2 diabetes mellitus with hyperglycemia: Secondary | ICD-10-CM | POA: Diagnosis not present

## 2023-06-10 DIAGNOSIS — Z0000 Encounter for general adult medical examination without abnormal findings: Secondary | ICD-10-CM | POA: Diagnosis not present

## 2023-06-11 DIAGNOSIS — Z96652 Presence of left artificial knee joint: Secondary | ICD-10-CM | POA: Diagnosis not present

## 2023-06-11 DIAGNOSIS — G309 Alzheimer's disease, unspecified: Secondary | ICD-10-CM | POA: Diagnosis not present

## 2023-06-19 DIAGNOSIS — Z0000 Encounter for general adult medical examination without abnormal findings: Secondary | ICD-10-CM | POA: Diagnosis not present

## 2023-07-01 DIAGNOSIS — Z79899 Other long term (current) drug therapy: Secondary | ICD-10-CM | POA: Diagnosis not present

## 2023-07-01 DIAGNOSIS — L433 Subacute (active) lichen planus: Secondary | ICD-10-CM | POA: Diagnosis not present

## 2023-07-06 DIAGNOSIS — E1165 Type 2 diabetes mellitus with hyperglycemia: Secondary | ICD-10-CM | POA: Diagnosis not present

## 2023-07-08 DIAGNOSIS — M069 Rheumatoid arthritis, unspecified: Secondary | ICD-10-CM | POA: Diagnosis not present

## 2023-07-08 DIAGNOSIS — R634 Abnormal weight loss: Secondary | ICD-10-CM | POA: Diagnosis not present

## 2023-07-08 DIAGNOSIS — L89152 Pressure ulcer of sacral region, stage 2: Secondary | ICD-10-CM | POA: Diagnosis not present

## 2023-07-08 DIAGNOSIS — I10 Essential (primary) hypertension: Secondary | ICD-10-CM | POA: Diagnosis not present

## 2023-07-08 DIAGNOSIS — E1169 Type 2 diabetes mellitus with other specified complication: Secondary | ICD-10-CM | POA: Diagnosis not present

## 2023-07-08 DIAGNOSIS — E7800 Pure hypercholesterolemia, unspecified: Secondary | ICD-10-CM | POA: Diagnosis not present

## 2023-07-08 DIAGNOSIS — R718 Other abnormality of red blood cells: Secondary | ICD-10-CM | POA: Insufficient documentation

## 2023-07-08 DIAGNOSIS — E7849 Other hyperlipidemia: Secondary | ICD-10-CM | POA: Diagnosis not present

## 2023-07-08 DIAGNOSIS — B372 Candidiasis of skin and nail: Secondary | ICD-10-CM | POA: Diagnosis not present

## 2023-07-08 DIAGNOSIS — L508 Other urticaria: Secondary | ICD-10-CM | POA: Diagnosis not present

## 2023-07-08 DIAGNOSIS — R195 Other fecal abnormalities: Secondary | ICD-10-CM | POA: Diagnosis not present

## 2023-07-08 DIAGNOSIS — E785 Hyperlipidemia, unspecified: Secondary | ICD-10-CM | POA: Diagnosis not present

## 2023-07-13 ENCOUNTER — Emergency Department (HOSPITAL_COMMUNITY)
Admission: EM | Admit: 2023-07-13 | Discharge: 2023-07-13 | Disposition: A | Discharge status: Home / Self Care | Payer: 59 | Attending: Emergency Medicine | Admitting: Emergency Medicine

## 2023-07-13 ENCOUNTER — Emergency Department (HOSPITAL_COMMUNITY): Payer: 59

## 2023-07-13 ENCOUNTER — Other Ambulatory Visit: Payer: Self-pay

## 2023-07-13 ENCOUNTER — Encounter (HOSPITAL_COMMUNITY): Payer: Self-pay

## 2023-07-13 DIAGNOSIS — M47816 Spondylosis without myelopathy or radiculopathy, lumbar region: Secondary | ICD-10-CM | POA: Diagnosis not present

## 2023-07-13 DIAGNOSIS — M1612 Unilateral primary osteoarthritis, left hip: Secondary | ICD-10-CM | POA: Diagnosis not present

## 2023-07-13 DIAGNOSIS — M545 Low back pain, unspecified: Secondary | ICD-10-CM | POA: Diagnosis not present

## 2023-07-13 DIAGNOSIS — M1711 Unilateral primary osteoarthritis, right knee: Secondary | ICD-10-CM | POA: Diagnosis not present

## 2023-07-13 DIAGNOSIS — M1990 Unspecified osteoarthritis, unspecified site: Secondary | ICD-10-CM | POA: Diagnosis not present

## 2023-07-13 DIAGNOSIS — G8929 Other chronic pain: Secondary | ICD-10-CM | POA: Insufficient documentation

## 2023-07-13 DIAGNOSIS — Z794 Long term (current) use of insulin: Secondary | ICD-10-CM | POA: Diagnosis not present

## 2023-07-13 DIAGNOSIS — M4317 Spondylolisthesis, lumbosacral region: Secondary | ICD-10-CM | POA: Diagnosis not present

## 2023-07-13 DIAGNOSIS — I10 Essential (primary) hypertension: Secondary | ICD-10-CM | POA: Insufficient documentation

## 2023-07-13 DIAGNOSIS — M25552 Pain in left hip: Secondary | ICD-10-CM | POA: Diagnosis not present

## 2023-07-13 DIAGNOSIS — E119 Type 2 diabetes mellitus without complications: Secondary | ICD-10-CM | POA: Diagnosis not present

## 2023-07-13 DIAGNOSIS — F0390 Unspecified dementia without behavioral disturbance: Secondary | ICD-10-CM | POA: Insufficient documentation

## 2023-07-13 DIAGNOSIS — M25561 Pain in right knee: Secondary | ICD-10-CM | POA: Diagnosis not present

## 2023-07-13 DIAGNOSIS — Z79899 Other long term (current) drug therapy: Secondary | ICD-10-CM | POA: Diagnosis not present

## 2023-07-13 DIAGNOSIS — M438X6 Other specified deforming dorsopathies, lumbar region: Secondary | ICD-10-CM | POA: Diagnosis not present

## 2023-07-13 DIAGNOSIS — M549 Dorsalgia, unspecified: Secondary | ICD-10-CM | POA: Diagnosis not present

## 2023-07-13 MED ORDER — HYDRALAZINE HCL 25 MG PO TABS
100.0000 mg | ORAL_TABLET | Freq: Once | ORAL | Status: AC
  Administered 2023-07-13: 100 mg via ORAL
  Filled 2023-07-13: qty 4

## 2023-07-13 MED ORDER — OXYCODONE-ACETAMINOPHEN 5-325 MG PO TABS: 1.0000 | ORAL_TABLET | Freq: Four times a day (QID) | ORAL | 0 refills | Status: AC | PRN

## 2023-07-13 MED ORDER — OXYCODONE-ACETAMINOPHEN 5-325 MG PO TABS
1.0000 | ORAL_TABLET | Freq: Once | ORAL | Status: AC
  Administered 2023-07-13: 1 via ORAL
  Filled 2023-07-13: qty 1

## 2023-07-13 NOTE — ED Provider Notes (Signed)
EMERGENCY DEPARTMENT AT [facility] Provider Note   CSN: [id number] Arrival date & time: 07/13/23  0645     History  Chief Complaint  Patient presents with   Back Pain    Carrie Patel is a 89 y.o. female with history of T2DM, HTN, rheumatoid arthritis, who presents to the ER complaining of back pain. Low back and L hip pain, worse since yesterday after sitting in one position all day. Daughter reports patient tried to get up and screamed out. Daughter also reports hx of chronic urinary incontinence. Gave patient 650 mg PO tylenol at 1 am. No trauma or falls. Used to be on methotrexate for RA, but there was no benefit so they stopped.   Level 5 caveat due to dementia. Daughter assisting with history.    Back Pain      Home Medications Prior to Admission medications   Medication Sig Start Date End Date Taking? Authorizing Provider  oxyCODONE-acetaminophen (PERCOCET/ROXICET) 5-325 MG tablet Take 1 tablet by mouth every 6 (six) hours as needed for severe pain. 07/13/23  Yes Ulyssa Walthour T, PA-C  augmented betamethasone dipropionate (DIPROLENE-AF) 0.05 % ointment Apply 1 Application topically daily. 05/14/22   [provider]  HUMALOG KWIKPEN 100 UNIT/ML KwikPen Inject into the skin daily. Sliding scale after eating 06/06/22   [provider]  hydrALAZINE (APRESOLINE) 50 MG tablet Take 50 mg by mouth 2 (two) times daily.     [provider]  hydrochlorothiazide (HYDRODIURIL) 25 MG tablet Take 25 mg by mouth daily.    [provider]  hydrOXYzine (ATARAX) 10 MG tablet Take 10 mg by mouth at bedtime. 06/04/22   [provider]  insulin degludec (TRESIBA FLEXTOUCH) 100 UNIT/ML FlexTouch Pen Inject 16 Units into the skin daily. 05/08/21   [provider]  losartan (COZAAR) 50 MG tablet Take 50 mg by mouth daily.    [provider]  metFORMIN (GLUCOPHAGE-XR) 500 MG 24 hr tablet Take 500 mg by mouth daily with  breakfast. 03/18/22   [provider]  metoprolol succinate (TOPROL-XL) 100 MG 24 hr tablet Take 100 mg by mouth daily. Take with or immediately following a meal.    [provider]  ondansetron (ZOFRAN-ODT) 4 MG disintegrating tablet Take 1 tablet (4 mg total) by mouth every 8 (eight) hours as needed for nausea or vomiting. 06/08/22   Sabas Sous, MD  pravastatin (PRAVACHOL) 80 MG tablet Take 80 mg by mouth daily.    [provider]  triamcinolone cream (KENALOG) 0.1 % Apply topically 2 (two) times daily. 06/04/22   [provider]  Vitamin D, Ergocalciferol, (DRISDOL) 1.25 MG (50000 UNIT) CAPS capsule Take 50,000 Units by mouth once a week. 04/16/22   [provider]  Vitamin D, Ergocalciferol, (DRISDOL) 1.25 MG (50000 UNIT) CAPS capsule 1 capsule Orally once weekly for 90 days 04/16/22   [provider]      Allergies    Patient has no known allergies.    Review of Systems   Review of Systems  Unable to perform ROS: Dementia  Musculoskeletal:  Positive for arthralgias and back pain.    Physical Exam Updated Vital Signs BP (!) 185/103 Comment: Pt has not taken her BP medicine fully today. Plans to take it as soon as she leaves.  Pulse 62   Temp 98.7 F (37.1 C) (Oral)   Resp 14   Ht 5\' 5"  (1.651 m)   Wt 74 kg   SpO2 100%  BMI 27.15 kg/m  Physical Exam Vitals and nursing note reviewed.  Constitutional:      Appearance: Normal appearance.  HENT:     Head: Normocephalic and atraumatic.  Eyes:     Conjunctiva/sclera: Conjunctivae normal.  Cardiovascular:     Rate and Rhythm: Normal rate and regular rhythm.  Pulmonary:     Effort: Pulmonary effort is normal. No respiratory distress.     Breath sounds: Normal breath sounds.  Abdominal:     General: There is no distension.     Palpations: Abdomen is soft.     Tenderness: There is no abdominal tenderness.  Musculoskeletal:     Comments: No midline spinal tenderness,  step-offs or crepitus.  Strength 5/5 in all extremities.  Sensation intact in all extremities. Pain with R knee flexion, L hip flexion. No bony abnormalities palpated. No point tenderness over the joints.   Skin:    General: Skin is warm and dry.  Neurological:     General: No focal deficit present.     Mental Status: She is alert.     ED Results / Procedures / Treatments   Labs (all labs ordered are listed, but only abnormal results are displayed) Labs Reviewed - No data to display  EKG None  Radiology DG Knee Complete 4 Views Right  Result Date: 07/13/2023 CLINICAL DATA:  painful R knee EXAM: RIGHT KNEE - COMPLETE 4+ VIEW COMPARISON:  None Available. FINDINGS: No fracture, dislocation, or effusion. Advanced loss of articular cartilage in medial and lateral compartments with subchondral sclerosis and cystic change, and marginal spurring. There is mild lateral subluxation of the tibia with respect to the femoral condyles. Small marginal spurs about the patellofemoral compartment. Patchy arterial calcifications. IMPRESSION: Advanced tricompartmental osteoarthritis, most pronounced in the medial and lateral compartments. Electronically Signed   By: Corlis Leak M.D.   On: 07/13/2023 09:55   DG Lumbar Spine Complete  Result Date: 07/13/2023 CLINICAL DATA:  Low back pain radiating into left hip EXAM: LUMBAR SPINE - COMPLETE 4+ VIEW; DG HIP (WITH OR WITHOUT PELVIS) 2-3V LEFT COMPARISON:  None available FINDINGS: Left hip: Mild joint space loss and spurring of the left hip. No fracture or dislocation. Soft tissues are normal. Lumbar spine: Dextroconvex curvature of the thoracolumbar spine. Grade 1 anterolisthesis of L5 on S1. Vertebral body heights are maintained. Mild disc height loss and endplate degenerative changes seen at L3-L4, L4-L5, L5-S1. Moderate facet degenerative changes seen throughout the lower lumbar spine, greatest at L4-L5. Atherosclerotic changes seen throughout visualized arterial  segments. IMPRESSION: 1. No acute abnormality of the left hip or lumbar spine. 2. Moderate multilevel degenerative changes of the lumbar spine. 3. Mild left hip osteoarthrosis. Electronically Signed   By: Acquanetta Belling M.D.   On: 07/13/2023 09:03   DG Hip Unilat W or Wo Pelvis 2-3 Views Left  Result Date: 07/13/2023 CLINICAL DATA:  Low back pain radiating into left hip EXAM: LUMBAR SPINE - COMPLETE 4+ VIEW; DG HIP (WITH OR WITHOUT PELVIS) 2-3V LEFT COMPARISON:  None available FINDINGS: Left hip: Mild joint space loss and spurring of the left hip. No fracture or dislocation. Soft tissues are normal. Lumbar spine: Dextroconvex curvature of the thoracolumbar spine. Grade 1 anterolisthesis of L5 on S1. Vertebral body heights are maintained. Mild disc height loss and endplate degenerative changes seen at L3-L4, L4-L5, L5-S1. Moderate facet degenerative changes seen throughout the lower lumbar spine, greatest at L4-L5. Atherosclerotic changes seen throughout visualized arterial segments. IMPRESSION: 1. No acute abnormality of  the left hip or lumbar spine. 2. Moderate multilevel degenerative changes of the lumbar spine. 3. Mild left hip osteoarthrosis. Electronically Signed   By: Acquanetta Belling M.D.   On: 07/13/2023 09:03    Procedures Procedures    Medications Ordered in ED Medications  oxyCODONE-acetaminophen (PERCOCET/ROXICET) 5-325 MG per tablet 1 tablet (1 tablet Oral Given 07/13/23 0901)  hydrALAZINE (APRESOLINE) tablet 100 mg (100 mg Oral Given 07/13/23 0900)    ED Course/ Medical Decision Making/ A&P                                 Medical Decision Making Amount and/or Complexity of Data Reviewed Radiology: ordered.  Risk Prescription drug management.  This patient is a 89 y.o. female  who presents to the ED for concern of back pain, hip pain, knee pain.   Differential diagnoses prior to evaluation: The emergent differential diagnosis includes, but is not limited to,  fracture, dislocation,  cauda equina, septic arthritis . This is not an exhaustive differential.   Past Medical History / Co-morbidities / Social History: T2DM, HTN, rheumatoid arthritis  Additional history: Chart reviewed. Pertinent results include: Follows with [facility]  Physical Exam: Physical exam performed. The pertinent findings include: No midline spinal tenderness, step offs or crepitus. No bony tenderness over joints, no overlying skin changes or increased warmth.   Lab Tests/Imaging studies: I personally interpreted labs/imaging and the pertinent results include: Lumbar x-ray with moderate multilevel degenerative changes of the lumbar spine, left hip shows joint space loss and spurring, consistent with osteo or arthrosis.  Right knee shows advanced tricompartmental osteoarthritis. I agree with the radiologist interpretation.  Medications: I ordered medication including home blood pressure medication, and Percocet.  I have reviewed the patients home medicines and have made adjustments as needed.   Disposition: After consideration of the diagnostic results and the patients response to treatment, I feel that emergency department workup does not suggest an emergent condition requiring admission or immediate intervention beyond what has been performed at this time. The plan is: Discharged to home with symptomatic management of joint pain likely in the setting of arthritis.  X-rays consistent with this.  No evidence of new trauma, both by patient report, and imaging.  Daughter reports that patient has follow-up with her orthopedist later this week, and I will prescribe a short course of pain medication to help her get through that time.  She will continue giving OTC meds as needed.. The patient is safe for discharge and has been instructed to return immediately for worsening symptoms, change in symptoms or any other concerns.  Final Clinical Impression(s) / ED Diagnoses Final diagnoses:  Chronic midline low  back pain without sciatica  Arthritis of right knee  Left hip pain    Rx / DC Orders ED Discharge Orders          Ordered    oxyCODONE-acetaminophen (PERCOCET/ROXICET) 5-325 MG tablet  Every 6 hours PRN        07/13/23 1018           Portions of this report may have been transcribed using voice recognition software. Every effort was made to ensure accuracy; however, inadvertent computerized transcription errors may be present.    Jeanella Flattery 07/13/23 1315    Lorre Nick, MD 07/16/23 1531

## 2023-07-13 NOTE — Discharge Instructions (Addendum)
Carrie Patel was seen in the ER for ongoing back, hip, and knee pain.  As we discussed, her x-rays show fairly significant arthritis in these joints. We gave her a tablet of pain medication, and I will write a few tablets of this until she can follow up with her orthopedist. Please monitor her carefully on this medication as it may increase her risk of falls.   Continue to monitor how she's doing and return to the ER for any new or worsening symptoms.

## 2023-07-13 NOTE — ED Triage Notes (Signed)
Pt BIB [facility] from Home with daughter d/t back pain that radiates down left hip.  Pt has hx of RA and " it feels the same".  Pt took 650 mg PO Tylenol @ 0100 but did not help.

## 2023-07-15 DIAGNOSIS — Z96652 Presence of left artificial knee joint: Secondary | ICD-10-CM | POA: Diagnosis not present

## 2023-07-15 DIAGNOSIS — G309 Alzheimer's disease, unspecified: Secondary | ICD-10-CM | POA: Diagnosis not present

## 2023-07-23 DIAGNOSIS — M545 Low back pain, unspecified: Secondary | ICD-10-CM | POA: Diagnosis not present

## 2023-07-23 DIAGNOSIS — M25552 Pain in left hip: Secondary | ICD-10-CM | POA: Diagnosis not present

## 2023-07-23 DIAGNOSIS — G8929 Other chronic pain: Secondary | ICD-10-CM | POA: Insufficient documentation

## 2023-08-05 DIAGNOSIS — I10 Essential (primary) hypertension: Secondary | ICD-10-CM | POA: Diagnosis not present

## 2023-08-05 DIAGNOSIS — E1165 Type 2 diabetes mellitus with hyperglycemia: Secondary | ICD-10-CM | POA: Diagnosis not present

## 2023-08-05 DIAGNOSIS — E7800 Pure hypercholesterolemia, unspecified: Secondary | ICD-10-CM | POA: Diagnosis not present

## 2023-08-06 DIAGNOSIS — E1165 Type 2 diabetes mellitus with hyperglycemia: Secondary | ICD-10-CM | POA: Diagnosis not present

## 2023-08-24 DIAGNOSIS — R195 Other fecal abnormalities: Secondary | ICD-10-CM | POA: Insufficient documentation

## 2023-08-24 DIAGNOSIS — L89152 Pressure ulcer of sacral region, stage 2: Secondary | ICD-10-CM | POA: Insufficient documentation

## 2023-08-24 DIAGNOSIS — R32 Unspecified urinary incontinence: Secondary | ICD-10-CM | POA: Insufficient documentation

## 2023-08-26 ENCOUNTER — Encounter (INDEPENDENT_AMBULATORY_CARE_PROVIDER_SITE_OTHER): Payer: 59 | Admitting: Ophthalmology

## 2023-09-06 DIAGNOSIS — E1165 Type 2 diabetes mellitus with hyperglycemia: Secondary | ICD-10-CM | POA: Diagnosis not present

## 2023-09-18 DIAGNOSIS — I10 Essential (primary) hypertension: Secondary | ICD-10-CM | POA: Diagnosis not present

## 2023-09-23 ENCOUNTER — Encounter (INDEPENDENT_AMBULATORY_CARE_PROVIDER_SITE_OTHER): Payer: 59 | Admitting: Ophthalmology

## 2023-09-23 DIAGNOSIS — E11319 Type 2 diabetes mellitus with unspecified diabetic retinopathy without macular edema: Secondary | ICD-10-CM | POA: Diagnosis not present

## 2023-09-23 DIAGNOSIS — H04123 Dry eye syndrome of bilateral lacrimal glands: Secondary | ICD-10-CM | POA: Diagnosis not present

## 2023-09-23 DIAGNOSIS — H01135 Eczematous dermatitis of left lower eyelid: Secondary | ICD-10-CM | POA: Diagnosis not present

## 2023-09-23 DIAGNOSIS — H40012 Open angle with borderline findings, low risk, left eye: Secondary | ICD-10-CM | POA: Diagnosis not present

## 2023-09-23 DIAGNOSIS — H01132 Eczematous dermatitis of right lower eyelid: Secondary | ICD-10-CM | POA: Diagnosis not present

## 2023-09-23 DIAGNOSIS — Z961 Presence of intraocular lens: Secondary | ICD-10-CM | POA: Diagnosis not present

## 2023-09-23 DIAGNOSIS — H01134 Eczematous dermatitis of left upper eyelid: Secondary | ICD-10-CM | POA: Diagnosis not present

## 2023-09-23 DIAGNOSIS — H35373 Puckering of macula, bilateral: Secondary | ICD-10-CM | POA: Diagnosis not present

## 2023-09-23 DIAGNOSIS — H01131 Eczematous dermatitis of right upper eyelid: Secondary | ICD-10-CM | POA: Diagnosis not present

## 2023-10-02 ENCOUNTER — Encounter (INDEPENDENT_AMBULATORY_CARE_PROVIDER_SITE_OTHER): Payer: 59 | Admitting: Ophthalmology

## 2023-10-02 DIAGNOSIS — H35033 Hypertensive retinopathy, bilateral: Secondary | ICD-10-CM | POA: Diagnosis not present

## 2023-10-02 DIAGNOSIS — Z794 Long term (current) use of insulin: Secondary | ICD-10-CM

## 2023-10-02 DIAGNOSIS — I10 Essential (primary) hypertension: Secondary | ICD-10-CM | POA: Diagnosis not present

## 2023-10-02 DIAGNOSIS — H43813 Vitreous degeneration, bilateral: Secondary | ICD-10-CM | POA: Diagnosis not present

## 2023-10-02 DIAGNOSIS — E113393 Type 2 diabetes mellitus with moderate nonproliferative diabetic retinopathy without macular edema, bilateral: Secondary | ICD-10-CM

## 2023-10-06 DIAGNOSIS — E1165 Type 2 diabetes mellitus with hyperglycemia: Secondary | ICD-10-CM | POA: Diagnosis not present

## 2023-10-09 ENCOUNTER — Ambulatory Visit: Payer: 59 | Admitting: Dermatology

## 2023-11-06 DIAGNOSIS — E1165 Type 2 diabetes mellitus with hyperglycemia: Secondary | ICD-10-CM | POA: Diagnosis not present

## 2023-12-06 DIAGNOSIS — E1165 Type 2 diabetes mellitus with hyperglycemia: Secondary | ICD-10-CM | POA: Diagnosis not present

## 2024-01-06 DIAGNOSIS — E1165 Type 2 diabetes mellitus with hyperglycemia: Secondary | ICD-10-CM | POA: Diagnosis not present

## 2024-02-06 DIAGNOSIS — E1165 Type 2 diabetes mellitus with hyperglycemia: Secondary | ICD-10-CM | POA: Diagnosis not present

## 2024-02-18 DIAGNOSIS — E785 Hyperlipidemia, unspecified: Secondary | ICD-10-CM | POA: Diagnosis not present

## 2024-02-18 DIAGNOSIS — E7849 Other hyperlipidemia: Secondary | ICD-10-CM | POA: Diagnosis not present

## 2024-02-18 DIAGNOSIS — E1169 Type 2 diabetes mellitus with other specified complication: Secondary | ICD-10-CM | POA: Diagnosis not present

## 2024-02-18 DIAGNOSIS — R718 Other abnormality of red blood cells: Secondary | ICD-10-CM | POA: Diagnosis not present

## 2024-02-18 DIAGNOSIS — L309 Dermatitis, unspecified: Secondary | ICD-10-CM | POA: Diagnosis not present

## 2024-02-18 DIAGNOSIS — L508 Other urticaria: Secondary | ICD-10-CM | POA: Diagnosis not present

## 2024-02-18 DIAGNOSIS — I10 Essential (primary) hypertension: Secondary | ICD-10-CM | POA: Diagnosis not present

## 2024-02-18 DIAGNOSIS — M069 Rheumatoid arthritis, unspecified: Secondary | ICD-10-CM | POA: Diagnosis not present

## 2024-03-05 DIAGNOSIS — E1165 Type 2 diabetes mellitus with hyperglycemia: Secondary | ICD-10-CM | POA: Diagnosis not present

## 2024-03-16 DIAGNOSIS — E1169 Type 2 diabetes mellitus with other specified complication: Secondary | ICD-10-CM | POA: Diagnosis not present

## 2024-03-16 DIAGNOSIS — I10 Essential (primary) hypertension: Secondary | ICD-10-CM | POA: Diagnosis not present

## 2024-03-16 DIAGNOSIS — L501 Idiopathic urticaria: Secondary | ICD-10-CM | POA: Diagnosis not present

## 2024-03-18 ENCOUNTER — Other Ambulatory Visit: Payer: Self-pay | Admitting: Dermatology

## 2024-03-18 ENCOUNTER — Ambulatory Visit: Payer: 59 | Admitting: Dermatology

## 2024-03-18 ENCOUNTER — Encounter: Payer: Self-pay | Admitting: Dermatology

## 2024-03-18 VITALS — BP 185/76 | HR 60

## 2024-03-18 DIAGNOSIS — L309 Dermatitis, unspecified: Secondary | ICD-10-CM

## 2024-03-18 DIAGNOSIS — L299 Pruritus, unspecified: Secondary | ICD-10-CM

## 2024-03-18 MED ORDER — TACROLIMUS 0.1 % EX OINT: TOPICAL_OINTMENT | CUTANEOUS | 3 refills | Status: DC

## 2024-03-18 MED ORDER — CLOBETASOL PROPIONATE 0.05 % EX CREA: TOPICAL_CREAM | CUTANEOUS | 3 refills | Status: DC

## 2024-03-18 MED ORDER — DUPIXENT 300 MG/2ML ~~LOC~~ SOAJ: 600.0000 mg | Freq: Once | SUBCUTANEOUS | 0 refills | Status: AC

## 2024-03-18 MED ORDER — DUPIXENT 300 MG/2ML ~~LOC~~ SOAJ: 300.0000 mg | SUBCUTANEOUS | 5 refills | Status: DC

## 2024-03-18 NOTE — Patient Instructions (Addendum)
 Hello Corrie Dandy,  Thank you for visiting today. Here is a summary of the key instructions:  - Medications:   - Stop taking acitretin   - Continue taking hydroxyzine at night for itching   - Start using Clobetasol Mixed with CeraVe anti itch twice a day for 2 weeks then take a break   - During your break from clobetasol, start using Tacrolimus ointmnet mixed with CeraVe Anti-Itch twice a day for 2 weeks - Keep alternating clobetasol every 2 weeks and Tacrolimus every 2 weeks  - New Treatment:   - Dupixent (injection) will be prescribed for eczema and itching   - May take 4 to 6 months to see results   - Injection training will be provided once approved by insurance, this will take 2-3 weeks and we will call you to schedule injecitn training once we get it approved  - Follow-up:   - Wait for insurance approval for Dupixent   - Attend injection training session when scheduled  Please reach out if you have any questions or concerns.  Warm regards,  Dr. Langston Reusing, Dermatology    Important Information  Due to recent changes in healthcare laws, you may see results of your pathology and/or laboratory studies on MyChart before the doctors have had a chance to review them. We understand that in some cases there may be results that are confusing or concerning to you. Please understand that not all results are received at the same time and often the doctors may need to interpret multiple results in order to provide you with the best plan of care or course of treatment. Therefore, we ask that you please give [place] 2 business days to thoroughly review all your results before contacting the office for clarification. Should we see a critical lab result, you will be contacted sooner.   If You Need Anything After Your Visit  If you have any questions or concerns for your doctor, please call our main line at [phone] If no one answers, please leave a voicemail as directed and we will return your  call as soon as possible. Messages left after 4 pm will be answered the following business day.   You may also send [place] a message via MyChart. We typically respond to MyChart messages within 1-2 business days.  For prescription refills, please ask your pharmacy to contact our office. Our fax number is [phone].  If you have an urgent issue when the clinic is closed that cannot wait until the next business day, you can page your doctor at the number below.    Please note that while we do our best to be available for urgent issues outside of office hours, we are not available 24/7.   If you have an urgent issue and are unable to reach [place], you may choose to seek medical care at your doctor's office, retail clinic, urgent care center, or emergency room.  If you have a medical emergency, please immediately call 911 or go to the emergency department. In the event of inclement weather, please call our main line at [phone] for an update on the status of any delays or closures.  Dermatology Medication Tips: Please keep the boxes that topical medications come in in order to help keep track of the instructions about where and how to use these. Pharmacies typically print the medication instructions only on the boxes and not directly on the medication tubes.   If your medication is too expensive, please contact our office at [phone]  or send [place] a message through MyChart.   We are unable to tell what your co-pay for medications will be in advance as this is different depending on your insurance coverage. However, we may be able to find a substitute medication at lower cost or fill out paperwork to get insurance to cover a needed medication.   If a prior authorization is required to get your medication covered by your insurance company, please allow [place] 1-2 business days to complete this process.  Drug prices often vary depending on where the prescription is filled and some pharmacies may offer  cheaper prices.  The website www.goodrx.com contains coupons for medications through different pharmacies. The prices here do not account for what the cost may be with help from insurance (it may be cheaper with your insurance), but the website can give you the price if you did not use any insurance.  - You can print the associated coupon and take it with your prescription to the pharmacy.  - You may also stop by our office during regular business hours and pick up a GoodRx coupon card.  - If you need your prescription sent electronically to a different pharmacy, notify our office through [facility] or by phone at [phone]

## 2024-03-18 NOTE — Progress Notes (Addendum)
 New Patient Visit   Subjective  Carrie Patel is a 89 y.o. female who presents for the following: Rash. Dur: 3 years. Has been to several dermatologists. Has been to allergists and endocrinologist. States did not get answer to what was going on.  The patient, Carrie Patel, presents with a history of chronic pruritus that has persisted for approximately two years. 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 She was prescribed topical creams and hydroxyzine, which provided minimal relief. In 2024, she began treatment with acitretin, which effectively resolved the cutaneous lesions but exacerbated skin dryness. The patient reports that the pruritus remains unchanged despite treatment. Currently, she experiences pruritus primarily on her arm and left side, with occasional generalized itching. She notes that the itching sometimes begins after taking her medications. The patient also mentions thick, pruritic skin on her feet.  The patient's medication history is significant. She was switched from lisinopril to losartan in January 2023, which postdates the onset of her pruritus. Her current regimen includes Humulin-R insulin, amlodipine (recently started), metoprolol, losartan, and hydrochlorothiazide. She takes hydroxyzine nightly for symptom management and applies triamcinolone  and occasionally CeraVe topically. The patient reports that her blood pressure has been elevated recently.     Daughter, Hadley Leu, is with patient and contributes to history.   The following portions of the chart were reviewed this encounter and updated as appropriate: medications, allergies, medical history  Review of Systems:  No other skin or systemic complaints except as noted in HPI or Assessment and Plan.  Objective  Well appearing patient in no apparent distress; mood and affect are within normal limits.  A focused  examination was performed of the following areas: Face, back, arms, legs  Relevant exam findings are noted in the Assessment and Plan.                     Assessment & Plan     Eczematous dermatitis with pruritus/Atopic Dermatitis  Exam: flat topped papules and excoriations at arms, hyperpigmented papules and patches, lichenification at legs, torso   IGA:3,  50% BSA, 6/10 itch, 7/10 Sleep Loss This patient has borderline severe atopic dermatitis with a SCORAD of 44      Chronic and persistent condition with duration or expected duration over one year. Condition is symptomatic/ bothersome to patient. Not currently at goal.   Treatment Plan:  Stop Acitretin   Continue Hydroxyzine 25 mg at bedtime.   Start Clobetasol  0.05% cream mixed with CeraVe Anti-Itch cream. Use Clobetasol  no longer than 2 weeks at a time.   Start Tacrolimus  0.1% ointment mixed with CeraVe Anti-Itch cream. Use during the 2 weeks you are taking a break from Clobetasol .   Plan to start Dupixent pending insurance approval. Will bring back for loading dose with samples when approved.   Dupixent MyWay form filled out today.   Dupilumab (Dupixent) is a treatment given by injection for adults and children with moderate-to-severe atopic dermatitis. Goal is control of skin condition, not cure. It is given as 2 injections at the first dose followed by 1 injection ever 2 weeks thereafter.  Young children are dosed monthly.  Potential side effects include allergic reaction, herpes infections, injection site reactions and conjunctivitis (inflammation of the eyes).  The use of Dupixent requires long term medication management, including periodic office visits.  Plan: Dupixent Initiation Indications:  Patient isn't a candidate for systemic therapy with methotrexate  or cyclosporine. Patient has been unresponsive to aggressive topical therapy.  Failed Treatments: Topical Steroids and Topical  Protopic   Treatment Protocol: 600 mg Monona day 0 then 300 mg Kelso every other week  Specific Contraindications Cyclosporine is contraindicated because the patient will not be able to complete the necessary follow-up labs. Methotrexate is contraindicated because the patient will not be able to complete the necessary follow-up labs. Phototherapy is contraindicated because the patient lives too far from the treatment location.   Dupixent Counseling: I discussed with the patient the risks of dupilumab including but not limited to eye infection and irritation, cold sores, injection site reactions, worsening of asthma, allergic reactions and increased risk of parasitic infection. Live vaccines should be avoided while taking dupilumab. Dupilumab will also interact with certain medications such as warfarin and cyclosporine. The patient understands that monitoring is required and they must alert us  or the primary physician if symptoms of infection or other concerning signs are noted.  Dupixent Monitoring: There is no laboratory monitoring requirement with Dupixent.   Return in about 4 months (around 07/18/2024) for Eczematous Dermatitis Follow Up.  I, Jill Parcell, CMA, am acting as scribe for [employer], DO.   Documentation: I have reviewed the above documentation for accuracy and completeness, and I agree with the above.  Louana Roup, DO

## 2024-04-05 DIAGNOSIS — E1165 Type 2 diabetes mellitus with hyperglycemia: Secondary | ICD-10-CM | POA: Diagnosis not present

## 2024-04-12 ENCOUNTER — Telehealth: Payer: Self-pay

## 2024-04-12 DIAGNOSIS — Z79899 Other long term (current) drug therapy: Secondary | ICD-10-CM

## 2024-04-12 NOTE — Progress Notes (Signed)
   04/12/2024  Patient ID: Carrie Patel, female   DOB: 11-20-1931, 89 y.o.   MRN: [id number]  Patient on report for "low but uncontrolled A1c" report  Per chart review, the patient is followed by an endocrinologist and is prescribed Humulin R 5-10 units subcutaneous sliding scale (SSI) three times daily; however, the patient reports only taking up to 4 units. Regarding diabetes-related maintenance medications, pravastatin 80 mg every other day was last dispensed on 04/03/2024 with a 90-day supply. Dr. Anson Basta indicates an adherence rate of 79%. According to Dr. Anson Basta and the current medication list in the PCP's EMR, the patient is only taking SSI insulin (Humulin R) for management of diabetes. I was unable to retrieve endocrinology notes via Care Everywhere in Epic or locate faxed notes from the endocrinologist in the PCP's EMR. There is a previous prescription for Tresiba 16 units daily, but during a prior medication review, the patient indicated she was not taking it. A hemoglobin A1c result was not available in the [facility] EMR. It appears the patient may be using a CGM; however, per a recent report, the patient's hemoglobin A1c is 8.9%. I attempted to contact the patient for clarification but was unsuccessful and left a voicemail requesting a call back. Plan to follow up in two weeks to discuss the case with the PCP before conducting further outreach, as current information on A1c levels and medication adherence is limited.   Thank you for allowing pharmacy to be a part of this patient's care. Alexandria Angel, PharmD Clinical Pharmacist Cell: [phone]

## 2024-04-13 ENCOUNTER — Other Ambulatory Visit: Payer: Self-pay

## 2024-04-13 MED ORDER — TACROLIMUS 0.1 % EX OINT: TOPICAL_OINTMENT | CUTANEOUS | 3 refills | Status: DC

## 2024-05-05 ENCOUNTER — Telehealth: Payer: Self-pay

## 2024-05-05 DIAGNOSIS — E1165 Type 2 diabetes mellitus with hyperglycemia: Secondary | ICD-10-CM | POA: Diagnosis not present

## 2024-05-05 NOTE — Telephone Encounter (Signed)
 LVMTCB to schedule injection teaching appointment.

## 2024-05-10 ENCOUNTER — Telehealth: Payer: Self-pay

## 2024-05-10 NOTE — Telephone Encounter (Signed)
 Spoke with pts daughter Hadley Leu). She advised she would like to hold off on doing the Dupixent injections. She stated she has been researching the medication and as of right now. She stated she stopped one of her moms BP medications and the itch seemed to have gotten a lot better. She states she does still have some itching but only in the morning when her skin feels dry. I advised pt to contact our office once she is ready to proceed with the injections.

## 2024-05-11 ENCOUNTER — Other Ambulatory Visit: Payer: Self-pay

## 2024-05-11 DIAGNOSIS — H6123 Impacted cerumen, bilateral: Secondary | ICD-10-CM | POA: Insufficient documentation

## 2024-05-11 DIAGNOSIS — E1169 Type 2 diabetes mellitus with other specified complication: Secondary | ICD-10-CM | POA: Diagnosis not present

## 2024-05-11 DIAGNOSIS — E7849 Other hyperlipidemia: Secondary | ICD-10-CM | POA: Diagnosis not present

## 2024-05-11 DIAGNOSIS — E785 Hyperlipidemia, unspecified: Secondary | ICD-10-CM | POA: Diagnosis not present

## 2024-05-11 DIAGNOSIS — R718 Other abnormality of red blood cells: Secondary | ICD-10-CM | POA: Diagnosis not present

## 2024-05-11 DIAGNOSIS — I10 Essential (primary) hypertension: Secondary | ICD-10-CM | POA: Diagnosis not present

## 2024-05-11 NOTE — Telephone Encounter (Signed)
 Ok.  Please cancel the script and we will discuss her itch status at her next follow up.

## 2024-06-02 DIAGNOSIS — E559 Vitamin D deficiency, unspecified: Secondary | ICD-10-CM | POA: Diagnosis not present

## 2024-06-02 DIAGNOSIS — I10 Essential (primary) hypertension: Secondary | ICD-10-CM | POA: Diagnosis not present

## 2024-06-02 DIAGNOSIS — E113311 Type 2 diabetes mellitus with moderate nonproliferative diabetic retinopathy with macular edema, right eye: Secondary | ICD-10-CM | POA: Insufficient documentation

## 2024-06-02 DIAGNOSIS — E1169 Type 2 diabetes mellitus with other specified complication: Secondary | ICD-10-CM | POA: Diagnosis not present

## 2024-06-02 DIAGNOSIS — E785 Hyperlipidemia, unspecified: Secondary | ICD-10-CM | POA: Diagnosis not present

## 2024-06-02 DIAGNOSIS — R634 Abnormal weight loss: Secondary | ICD-10-CM | POA: Diagnosis not present

## 2024-06-02 DIAGNOSIS — L508 Other urticaria: Secondary | ICD-10-CM | POA: Diagnosis not present

## 2024-06-02 DIAGNOSIS — M069 Rheumatoid arthritis, unspecified: Secondary | ICD-10-CM | POA: Diagnosis not present

## 2024-06-05 DIAGNOSIS — E1165 Type 2 diabetes mellitus with hyperglycemia: Secondary | ICD-10-CM | POA: Diagnosis not present

## 2024-06-09 ENCOUNTER — Telehealth: Payer: Self-pay

## 2024-06-09 NOTE — Progress Notes (Signed)
   06/09/2024  Patient ID: Carrie Patel, female   DOB: [birth year], 89 y.o.   MRN: [id number]  Contacted patient regarding diabetes from a quality report for [facility]. Patient was on [employer] DM roster for A1c > 8. I left a HIPAA compliant voicemail, will follow up next week.   Heather Factor, PharmD Clinical Pharmacist  [phone]

## 2024-06-15 DIAGNOSIS — M8580 Other specified disorders of bone density and structure, unspecified site: Secondary | ICD-10-CM | POA: Diagnosis not present

## 2024-06-15 DIAGNOSIS — I739 Peripheral vascular disease, unspecified: Secondary | ICD-10-CM | POA: Diagnosis not present

## 2024-06-15 DIAGNOSIS — E538 Deficiency of other specified B group vitamins: Secondary | ICD-10-CM | POA: Diagnosis not present

## 2024-06-15 DIAGNOSIS — M150 Primary generalized (osteo)arthritis: Secondary | ICD-10-CM | POA: Diagnosis not present

## 2024-06-15 DIAGNOSIS — I10 Essential (primary) hypertension: Secondary | ICD-10-CM | POA: Diagnosis not present

## 2024-06-15 DIAGNOSIS — E559 Vitamin D deficiency, unspecified: Secondary | ICD-10-CM | POA: Diagnosis not present

## 2024-06-15 DIAGNOSIS — E1165 Type 2 diabetes mellitus with hyperglycemia: Secondary | ICD-10-CM | POA: Diagnosis not present

## 2024-06-15 DIAGNOSIS — E7800 Pure hypercholesterolemia, unspecified: Secondary | ICD-10-CM | POA: Diagnosis not present

## 2024-06-21 ENCOUNTER — Ambulatory Visit (INDEPENDENT_AMBULATORY_CARE_PROVIDER_SITE_OTHER): Admitting: Dermatology

## 2024-06-21 ENCOUNTER — Encounter: Payer: Self-pay | Admitting: Dermatology

## 2024-06-21 VITALS — BP 141/87 | HR 52

## 2024-06-21 DIAGNOSIS — E119 Type 2 diabetes mellitus without complications: Secondary | ICD-10-CM

## 2024-06-21 DIAGNOSIS — J309 Allergic rhinitis, unspecified: Secondary | ICD-10-CM | POA: Insufficient documentation

## 2024-06-21 DIAGNOSIS — L299 Pruritus, unspecified: Secondary | ICD-10-CM | POA: Insufficient documentation

## 2024-06-21 DIAGNOSIS — Z794 Long term (current) use of insulin: Secondary | ICD-10-CM | POA: Diagnosis not present

## 2024-06-21 DIAGNOSIS — L309 Dermatitis, unspecified: Secondary | ICD-10-CM

## 2024-06-21 DIAGNOSIS — J301 Allergic rhinitis due to pollen: Secondary | ICD-10-CM | POA: Insufficient documentation

## 2024-06-21 DIAGNOSIS — L209 Atopic dermatitis, unspecified: Secondary | ICD-10-CM | POA: Diagnosis not present

## 2024-06-21 NOTE — Progress Notes (Signed)
   Follow-Up Visit   Subjective  Carrie Patel is a 89 y.o. female accompanied by daughter Gearld) who presents for the following: (Eczema)  Patient present today for follow up visit for Eczema. Patient was last evaluated on 03/18/2024. At this visit patient was prescribed Dupixent , clobetasol  & Tacrolimus  as a topical. Daughter reports she was unable to get the Tacrolimus  because it is out of stock. Daughter deferred treatment because her mom's skin was not as itchy once she stopped a few meds. Patient reports her pcp (Dr. Roanna) changed her insulin dose which has helped tremendously. Patient reports sxs are better. Patient denies medication changes. Her Daughter reports she will have itching if her sugars are high (over 200s) but she keeps her well regulated.  The following portions of the chart were reviewed this encounter and updated as appropriate: medications, allergies, medical history  Review of Systems:  No other skin or systemic complaints except as noted in HPI or Assessment and Plan.  Objective  Well appearing patient in no apparent distress; mood and affect are within normal limits.  A full examination was performed including scalp, head, eyes, ears, nose, lips, neck, chest, axillae, abdomen, back, buttocks, bilateral upper extremities, bilateral lower extremities, hands, feet, fingers, toes, fingernails, and toenails. All findings within normal limits unless otherwise noted below.   Relevant exam findings are noted in the Assessment and Plan.    Assessment & Plan   ATOPIC DERMATITIS Exam: Scaly pink papules coalescing to plaques 10% BSA  Improved  - Assessment: Patient reports improvement in skin condition since April, correlating with better diabetes management through long-acting insulin. Itching occurs primarily on extremities and back of legs, with breakouts when blood sugar exceeds 230 mg/dL. Morning dryness is noted. Previous treatment with clobetasol  was partially  effective but inconsistently used. Tacrolimus  was prescribed but not obtained due to pharmacy availability issues.   Treatment Plan:    Continue clobetasol : apply small amount twice daily for up to 2 weeks    Prescribe tacrolimus  for daily treatment during steroid breaks     - Patient to call different pharmacies to check availability and request prescription transfer    In the interim, use Aquaphor or Sarna with Vaseline    Alternate clobetasol  and tacrolimus  for optimal management    Discussed Dupixent  as a potential future treatment option     - Explained administration: loading dose of two injections, then one injection every 2 weeks     - Informed about rare side effect of conjunctivitis     - Patient opted to defer initiation due to upcoming surgery and other commitments  2. Diabetes Mellitus - Assessment: Patient's diabetes management has improved with the addition of long-acting insulin, which has positively impacted skin condition. Blood glucose levels above 230 mg/dL trigger skin breakouts.  - Plan:    Continue current diabetes management, including long-acting insulin (specific regimen not discussed)    Monitor blood glucose levels, aiming to keep below 230 mg/dL to minimize skin breakouts    Return in about 12 weeks (around 09/13/2024) for Eczema F/U.  I, Jetta Ager, am acting as Neurosurgeon for [employer], DO.  Documentation: I have reviewed the above documentation for accuracy and completeness, and I agree with the above.  Delon Lenis, DO

## 2024-06-21 NOTE — Patient Instructions (Addendum)
 Date: Mon Jun 21 2024  Hello Carrie Patel,  Thank you for visiting today. Here is a summary of the key instructions:  Medications: - Clobetasol  cream   - Use: Apply a small amount twice daily   - Duration: Up to 2 weeks on itchy areas - Moisturizers   - Apply Aquaphor or Sarna with Vaseline to affected areas -Tacrolimus  ointment   -Apply to itchy areas twice a day for 2 weeks when taking a break from clobetasol   Treatment Plan: - Alternate between clobetasol  and tacrolimus  creams for best results - Call different pharmacies to check if they have tacrolimus  in stock - Ask us  to transfer the tacrolimus  prescription to a pharmacy that has it available  Follow-up: - Return for a follow-up appointment in 8 weeks - We will discuss the option of Dupixent  injections at the next visit  Please reach out if you have any questions or concerns.  Warm regards,  Dr. Delon Lenis Dermatology      Important Information   Due to recent changes in healthcare laws, you may see results of your pathology and/or laboratory studies on MyChart before the doctors have had a chance to review them. We understand that in some cases there may be results that are confusing or concerning to you. Please understand that not all results are received at the same time and often the doctors may need to interpret multiple results in order to provide you with the best plan of care or course of treatment. Therefore, we ask that you please give us  2 business days to thoroughly review all your results before contacting the office for clarification. Should we see a critical lab result, you will be contacted sooner.     If You Need Anything After Your Visit   If you have any questions or concerns for your doctor, please call our main line at [phone]. If no one answers, please leave a voicemail as directed and we will return your call as soon as possible. Messages left after 4 pm will be answered the following  business day.    You may also send us  a message via MyChart. We typically respond to MyChart messages within 1-2 business days.  For prescription refills, please ask your pharmacy to contact our office. Our fax number is [phone].  If you have an urgent issue when the clinic is closed that cannot wait until the next business day, you can page your doctor at the number below.     Please note that while we do our best to be available for urgent issues outside of office hours, we are not available 24/7.    If you have an urgent issue and are unable to reach us , you may choose to seek medical care at your doctor's office, retail clinic, urgent care center, or emergency room.   If you have a medical emergency, please immediately call 911 or go to the emergency department. In the event of inclement weather, please call our main line at [phone] for an update on the status of any delays or closures.  Dermatology Medication Tips: Please keep the boxes that topical medications come in in order to help keep track of the instructions about where and how to use these. Pharmacies typically print the medication instructions only on the boxes and not directly on the medication tubes.   If your medication is too expensive, please contact our office at [phone] or send us  a message through MyChart.    We are unable  to tell what your co-pay for medications will be in advance as this is different depending on your insurance coverage. However, we may be able to find a substitute medication at lower cost or fill out paperwork to get insurance to cover a needed medication.    If a prior authorization is required to get your medication covered by your insurance company, please allow us  1-2 business days to complete this process.   Drug prices often vary depending on where the prescription is filled and some pharmacies may offer cheaper prices.   The website www.goodrx.com contains coupons for  medications through different pharmacies. The prices here do not account for what the cost may be with help from insurance (it may be cheaper with your insurance), but the website can give you the price if you did not use any insurance.  - You can print the associated coupon and take it with your prescription to the pharmacy.  - You may also stop by our office during regular business hours and pick up a GoodRx coupon card.  - If you need your prescription sent electronically to a different pharmacy, notify our office through [facility] or by phone at [phone]

## 2024-06-29 ENCOUNTER — Encounter (HOSPITAL_COMMUNITY): Payer: Self-pay

## 2024-06-29 ENCOUNTER — Emergency Department (HOSPITAL_COMMUNITY)
Admission: EM | Admit: 2024-06-29 | Discharge: 2024-06-29 | Disposition: A | Discharge status: Home / Self Care | Attending: Emergency Medicine | Admitting: Emergency Medicine

## 2024-06-29 ENCOUNTER — Emergency Department (HOSPITAL_COMMUNITY)

## 2024-06-29 ENCOUNTER — Other Ambulatory Visit: Payer: Self-pay

## 2024-06-29 DIAGNOSIS — R069 Unspecified abnormalities of breathing: Secondary | ICD-10-CM | POA: Diagnosis not present

## 2024-06-29 DIAGNOSIS — I10 Essential (primary) hypertension: Secondary | ICD-10-CM | POA: Insufficient documentation

## 2024-06-29 DIAGNOSIS — Z79899 Other long term (current) drug therapy: Secondary | ICD-10-CM | POA: Insufficient documentation

## 2024-06-29 DIAGNOSIS — R0602 Shortness of breath: Secondary | ICD-10-CM | POA: Diagnosis not present

## 2024-06-29 DIAGNOSIS — Z794 Long term (current) use of insulin: Secondary | ICD-10-CM | POA: Insufficient documentation

## 2024-06-29 DIAGNOSIS — I959 Hypotension, unspecified: Secondary | ICD-10-CM | POA: Diagnosis not present

## 2024-06-29 DIAGNOSIS — F0390 Unspecified dementia without behavioral disturbance: Secondary | ICD-10-CM | POA: Diagnosis not present

## 2024-06-29 DIAGNOSIS — R000 Tachycardia, unspecified: Secondary | ICD-10-CM | POA: Diagnosis not present

## 2024-06-29 DIAGNOSIS — R4182 Altered mental status, unspecified: Secondary | ICD-10-CM | POA: Diagnosis not present

## 2024-06-29 DIAGNOSIS — R062 Wheezing: Secondary | ICD-10-CM | POA: Insufficient documentation

## 2024-06-29 DIAGNOSIS — E119 Type 2 diabetes mellitus without complications: Secondary | ICD-10-CM | POA: Diagnosis not present

## 2024-06-29 DIAGNOSIS — R531 Weakness: Secondary | ICD-10-CM | POA: Diagnosis not present

## 2024-06-29 LAB — COMPREHENSIVE METABOLIC PANEL WITH GFR
ALT: 8 U/L (ref 0–44)
AST: 13 U/L — ABNORMAL LOW (ref 15–41)
Albumin: 3.3 g/dL — ABNORMAL LOW (ref 3.5–5.0)
Alkaline Phosphatase: 79 U/L (ref 38–126)
Anion gap: 14 (ref 5–15)
BUN: 11 mg/dL (ref 8–23)
CO2: 19 mmol/L — ABNORMAL LOW (ref 22–32)
Calcium: 9 mg/dL (ref 8.9–10.3)
Chloride: 101 mmol/L (ref 98–111)
Creatinine, Ser: 0.63 mg/dL (ref 0.44–1.00)
GFR, Estimated: >60 mL/min (ref >60)
Glucose, Bld: 324 mg/dL — ABNORMAL HIGH (ref 70–99)
Potassium: 3.7 mmol/L (ref 3.5–5.1)
Sodium: 134 mmol/L — ABNORMAL LOW (ref 135–145)
Total Bilirubin: 0.7 mg/dL (ref 0.0–1.2)
Total Protein: 7.1 g/dL (ref 6.5–8.1)

## 2024-06-29 LAB — CBC WITH DIFFERENTIAL/PLATELET
Abs Immature Granulocytes: 0.04 K/uL (ref 0.00–0.07)
Basophils Absolute: 0 K/uL (ref 0.0–0.1)
Basophils Relative: 0 %
Eosinophils Absolute: 0 K/uL (ref 0.0–0.5)
Eosinophils Relative: 0 %
HCT: 37.6 % (ref 36.0–46.0)
Hemoglobin: 11.6 g/dL — ABNORMAL LOW (ref 12.0–15.0)
Immature Granulocytes: 0 %
Lymphocytes Relative: 6 %
Lymphs Abs: 0.6 K/uL — ABNORMAL LOW (ref 0.7–4.0)
MCH: 25.7 pg — ABNORMAL LOW (ref 26.0–34.0)
MCHC: 30.9 g/dL (ref 30.0–36.0)
MCV: 83.4 fL (ref 80.0–100.0)
Monocytes Absolute: 0.2 K/uL (ref 0.1–1.0)
Monocytes Relative: 2 %
Neutro Abs: 9.7 K/uL — ABNORMAL HIGH (ref 1.7–7.7)
Neutrophils Relative %: 92 %
Platelets: 255 K/uL (ref 150–400)
RBC: 4.51 MIL/uL (ref 3.87–5.11)
RDW: 14.9 % (ref 11.5–15.5)
WBC: 10.6 K/uL — ABNORMAL HIGH (ref 4.0–10.5)
nRBC: 0 % (ref 0.0–0.2)

## 2024-06-29 LAB — RESP PANEL BY RT-PCR (RSV, FLU A&B, COVID)  RVPGX2
Influenza A by PCR: NEGATIVE
Influenza B by PCR: NEGATIVE
Resp Syncytial Virus by PCR: NEGATIVE
SARS Coronavirus 2 by RT PCR: NEGATIVE

## 2024-06-29 LAB — BLOOD GAS, VENOUS
Acid-Base Excess: 1.5 mmol/L (ref 0.0–2.0)
Bicarbonate: 24.7 mmol/L (ref 20.0–28.0)
O2 Saturation: 61.4 %
Patient temperature: 37
pCO2, Ven: 34 mmHg — ABNORMAL LOW (ref 44–60)
pH, Ven: 7.47 — ABNORMAL HIGH (ref 7.25–7.43)
pO2, Ven: 31 mmHg — CL (ref 32–45)

## 2024-06-29 MED ORDER — SODIUM CHLORIDE 0.9 % IV BOLUS
500.0000 mL | Freq: Once | INTRAVENOUS | Status: AC
  Administered 2024-06-29: 500 mL via INTRAVENOUS

## 2024-06-29 MED ORDER — IPRATROPIUM BROMIDE 0.02 % IN SOLN
0.5000 mg | Freq: Once | RESPIRATORY_TRACT | Status: AC
  Administered 2024-06-29: 0.5 mg via RESPIRATORY_TRACT
  Filled 2024-06-29: qty 2.5

## 2024-06-29 MED ORDER — METHYLPREDNISOLONE SODIUM SUCC 125 MG IJ SOLR: 125.0000 mg | Freq: Once | INTRAMUSCULAR | Status: DC

## 2024-06-29 MED ORDER — ALBUTEROL SULFATE (2.5 MG/3ML) 0.083% IN NEBU
5.0000 mg | INHALATION_SOLUTION | Freq: Once | RESPIRATORY_TRACT | Status: AC
  Administered 2024-06-29: 5 mg via RESPIRATORY_TRACT
  Filled 2024-06-29: qty 6

## 2024-06-29 MED ORDER — FAMOTIDINE IN NACL 20-0.9 MG/50ML-% IV SOLN
20.0000 mg | Freq: Once | INTRAVENOUS | Status: AC
  Administered 2024-06-29: 20 mg via INTRAVENOUS
  Filled 2024-06-29: qty 50

## 2024-06-29 MED ORDER — OMEPRAZOLE 20 MG PO CPDR: 20.0000 mg | DELAYED_RELEASE_CAPSULE | Freq: Every day | ORAL | 0 refills | Status: AC

## 2024-06-29 NOTE — ED Provider Notes (Signed)
 Blood pressure 139/72, pulse (!) 111, temperature 98.4 F (36.9 C), temperature source Oral, resp. rate 13, SpO2 100%.  Assuming care from Dr. Patt.  In short, Carrie Patel is a 89 y.o. female with a chief complaint of Shortness of Breath .  Refer to the original H&P for additional details.  The current plan of care is to follow up on labs.  05:37 PM  On reassessment patient is very well-appearing.  She is not requiring supplemental oxygen.  She is eating, alert, interactive.  Family at bedside and caregivers note that she has had episodes like this in the past related to reflux.  Their strong preference is to have her return home.  We discussed that her heart rate is elevated but possibly this is due to multiple nebulizers and other treatments prior to ED evaluation.  They would like to undergo a period of ED observation and reassess. Patient has no complaints and looks well.   08:00 PM  Patient observed in the emergency department now with improved heart rate now in the 90s.  She appears to be in sinus rhythm with occasional PVCs.  She is eating, drinking, awake/alert.  No hypoxemia.  In discussion with family we will start her on omeprazole  and she will follow closely with her primary care doctor.  We did discuss observation admission but I do feel that patient is appropriate for discharge given her rapid improvement.    Darra Fonda MATSU, MD 06/29/24 2000

## 2024-06-29 NOTE — Discharge Instructions (Signed)
 You were seen in the emerged from today with trouble breathing.  This may be due to reflux and we are starting you on omeprazole .  Your heart labs are normal and chest x-ray was clear.  Please follow closely with your primary care doctor.  Return immediately to the emergency department with any return of symptoms.

## 2024-06-29 NOTE — ED Triage Notes (Signed)
 Pt BIB EMS from home for possible UTI, increased lethargy for 3 days. Pt developed increased work of breathing and wheezing today. The following medications were given and there has been an improvement per EMS. Pt went from a non rebreather to 3L nasal cannula.   10 albuterol  1 artovent 125 solumedrol  222 cbg  Hx dementia

## 2024-06-29 NOTE — ED Provider Notes (Signed)
 Carrie Patel Provider Note   CSN: [id number] Arrival date & time: 06/29/24  1251     Patient presents with: Shortness of Breath   Carrie Patel is a 89 y.o. female with history of hypertension, diabetes, rheumatoid arthritis, here presenting with shortness of breath and possible UTI.  Patient is at home with family.  Patient was noted to have darker urine than usual.  Patient also was more confused than usual.  Patient was noted to have some respiratory distress today.  EMS was called and patient was noted to be wheezing.  She received 2 DuoNebs as well as Solu-Medrol .  She was initially on nonrebreather and they were able to titrate her down to 2 L nasal cannula.  Patient unable to give me much history due to dementia.   The history is provided by the patient.       Prior to Admission medications   Medication Sig Start Date End Date Taking? Authorizing Provider  acitretin (SORIATANE) 25 MG capsule Take 25 mg by mouth daily.    [provider]  amLODipine (NORVASC) 5 MG tablet Take 5 mg by mouth daily. 03/16/24   [provider]  augmented betamethasone dipropionate (DIPROLENE-AF) 0.05 % ointment Apply 1 Application topically daily. 05/14/22   [provider]  clobetasol  cream (TEMOVATE ) 0.05 % Apply twice daily to affected area on body 2 weeks on 2 weeks off. Avoid applying to face, groin, and axilla. 03/18/24   Alm Delon SAILOR, DO  HUMALOG KWIKPEN 100 UNIT/ML KwikPen Inject into the skin daily. Sliding scale after eating 06/06/22   [provider]  HUMULIN R 100 UNIT/ML injection INJECT 5 TO 10 UNITS ACCORDING TO SLIDING SCALE PRIOR TO MEALS 3 TIMES DAILY    [provider]  hydrALAZINE  (APRESOLINE ) 100 MG tablet TAKE 1 TABLET BY MOUTH Three time A DAY WITH FOOD FOR 90 DAYS 06/04/22   [provider]  hydrALAZINE  (APRESOLINE ) 50 MG tablet Take 50 mg by mouth 2 (two) times daily.     [provider]  hydrochlorothiazide (HYDRODIURIL) 25 MG tablet Take 25 mg by mouth daily.    [provider]  hydrOXYzine (ATARAX) 10 MG tablet Take 10 mg by mouth at bedtime. 06/04/22   [provider]  hydrOXYzine (ATARAX) 25 MG tablet Take 25 mg by mouth at bedtime as needed.    [provider]  insulin degludec (TRESIBA FLEXTOUCH) 100 UNIT/ML FlexTouch Pen Inject 16 Units into the skin daily. 05/08/21   [provider]  losartan (COZAAR) 50 MG tablet Take 50 mg by mouth daily.    [provider]  metFORMIN (GLUCOPHAGE-XR) 500 MG 24 hr tablet Take 500 mg by mouth daily with breakfast. 03/18/22   [provider]  metoprolol succinate (TOPROL-XL) 100 MG 24 hr tablet Take 100 mg by mouth daily. Take with or immediately following a meal.    [provider]  ondansetron  (ZOFRAN -ODT) 4 MG disintegrating tablet Take 1 tablet (4 mg total) by mouth every 8 (eight) hours as needed for nausea or vomiting. 06/08/22   Theadore Ozell HERO, MD  oxyCODONE -acetaminophen  (PERCOCET/ROXICET) 5-325 MG tablet Take 1 tablet by mouth every 6 (six) hours as needed for severe pain. 07/13/23   Roemhildt, Lorin T, PA-C  pravastatin (PRAVACHOL) 80 MG tablet Take 80 mg by mouth daily.    [provider]  tacrolimus  (PROTOPIC ) 0.1 % ointment Apply twice daily 2 weeks on 2 weeks off 04/13/24  Alm Delon SAILOR, DO  triamcinolone  cream (KENALOG ) 0.1 % Apply topically 2 (two) times daily. 06/04/22   [provider]  Vitamin D, Ergocalciferol, (DRISDOL) 1.25 MG (50000 UNIT) CAPS capsule Take 50,000 Units by mouth once a week. 04/16/22   [provider]  Vitamin D, Ergocalciferol, (DRISDOL) 1.25 MG (50000 UNIT) CAPS capsule 1 capsule Orally once weekly for 90 days 04/16/22   [provider]    Allergies: Insulin and Insulin aspart (human analog) (yeast)    Review of Systems  Respiratory:  Positive for shortness of breath.   All other systems  reviewed and are negative.   Updated Vital Signs There were no vitals taken for this visit.  Physical Exam Vitals and nursing note reviewed.  Constitutional:      Comments: Chronically ill and tachypneic  HENT:     Head: Normocephalic.     Mouth/Throat:     Mouth: Mucous membranes are moist.  Eyes:     Extraocular Movements: Extraocular movements intact.     Pupils: Pupils are equal, round, and reactive to light.  Cardiovascular:     Rate and Rhythm: Normal rate and regular rhythm.  Pulmonary:     Comments: Mild wheezing bilateral bases.  No obvious crackles Musculoskeletal:        General: Normal range of motion.     Cervical back: Normal range of motion and neck supple.  Skin:    General: Skin is warm.     Capillary Refill: Capillary refill takes less than 2 seconds.  Neurological:     Comments: Demented and confused.  Moving all extremities  Psychiatric:     Comments: Unable      (all labs ordered are listed, but only abnormal results are displayed) Labs Reviewed  RESP PANEL BY RT-PCR (RSV, FLU A&B, COVID)  RVPGX2  BLOOD GAS, VENOUS  CBC WITH DIFFERENTIAL/PLATELET  COMPREHENSIVE METABOLIC PANEL WITH GFR  URINALYSIS, W/ REFLEX TO CULTURE (INFECTION SUSPECTED)    EKG: None  Radiology: No results found.   Procedures   Medications Ordered in the ED  albuterol  (PROVENTIL ) (2.5 MG/3ML) 0.083% nebulizer solution 5 mg (has no administration in time range)  ipratropium (ATROVENT ) nebulizer solution 0.5 mg (has no administration in time range)                                    Medical Decision Making Carrie Patel is a 89 y.o. female here with shortness of breath and wheezing and altered mental status.  Consider pneumonia versus bronchitis versus COVID or flu or RSV.  Also consider urinary tract infection.  Plan to get CBC and CMP and chest x-ray and VBG and urinalysis.  Will give albuterol  and reassess.  3:16 PM VBG reassuring.  COVID and flu or RSV negative.   Chest x-ray did not show any pneumonia.  Labs are pending.  Unfortunately the nurse was unable to get urine during In-N-Out cath.  Will try to give IV fluids and do p.o. work to collect urine sample.  Also it is unclear if she developed new onset A-fib or she was tachycardic from the albuterol .  Anticipate that she will need a repeat EKG as well. Signed out to Dr. Darra in the ED.   Amount and/or Complexity of Data Reviewed Labs: ordered. Radiology: ordered. ECG/medicine tests: ordered.  Risk Prescription drug management.     Final diagnoses:  None    ED Discharge  Orders     None          Patt Alm Macho, MD 06/29/24 [phone]

## 2024-06-29 NOTE — ED Notes (Signed)
 Provider at bedside

## 2024-07-05 DIAGNOSIS — E1165 Type 2 diabetes mellitus with hyperglycemia: Secondary | ICD-10-CM | POA: Diagnosis not present

## 2024-07-12 DIAGNOSIS — L508 Other urticaria: Secondary | ICD-10-CM | POA: Diagnosis not present

## 2024-07-12 DIAGNOSIS — E785 Hyperlipidemia, unspecified: Secondary | ICD-10-CM | POA: Diagnosis not present

## 2024-07-12 DIAGNOSIS — R600 Localized edema: Secondary | ICD-10-CM | POA: Diagnosis not present

## 2024-07-12 DIAGNOSIS — I10 Essential (primary) hypertension: Secondary | ICD-10-CM | POA: Diagnosis not present

## 2024-07-12 DIAGNOSIS — E559 Vitamin D deficiency, unspecified: Secondary | ICD-10-CM | POA: Diagnosis not present

## 2024-07-12 DIAGNOSIS — E1169 Type 2 diabetes mellitus with other specified complication: Secondary | ICD-10-CM | POA: Diagnosis not present

## 2024-07-12 DIAGNOSIS — H9193 Unspecified hearing loss, bilateral: Secondary | ICD-10-CM | POA: Diagnosis not present

## 2024-08-05 DIAGNOSIS — E1165 Type 2 diabetes mellitus with hyperglycemia: Secondary | ICD-10-CM | POA: Diagnosis not present

## 2024-08-19 DIAGNOSIS — E7849 Other hyperlipidemia: Secondary | ICD-10-CM | POA: Diagnosis not present

## 2024-08-19 DIAGNOSIS — I10 Essential (primary) hypertension: Secondary | ICD-10-CM | POA: Diagnosis not present

## 2024-08-19 DIAGNOSIS — E1169 Type 2 diabetes mellitus with other specified complication: Secondary | ICD-10-CM | POA: Diagnosis not present

## 2024-08-19 DIAGNOSIS — E559 Vitamin D deficiency, unspecified: Secondary | ICD-10-CM | POA: Diagnosis not present

## 2024-08-19 DIAGNOSIS — Z1329 Encounter for screening for other suspected endocrine disorder: Secondary | ICD-10-CM | POA: Diagnosis not present

## 2024-08-19 DIAGNOSIS — E785 Hyperlipidemia, unspecified: Secondary | ICD-10-CM | POA: Diagnosis not present

## 2024-08-23 ENCOUNTER — Other Ambulatory Visit: Payer: Self-pay | Admitting: Dermatology

## 2024-08-26 ENCOUNTER — Ambulatory Visit (INDEPENDENT_AMBULATORY_CARE_PROVIDER_SITE_OTHER): Admitting: Dermatology

## 2024-08-26 DIAGNOSIS — L209 Atopic dermatitis, unspecified: Secondary | ICD-10-CM

## 2024-08-26 DIAGNOSIS — Z0001 Encounter for general adult medical examination with abnormal findings: Secondary | ICD-10-CM | POA: Diagnosis not present

## 2024-08-26 DIAGNOSIS — K219 Gastro-esophageal reflux disease without esophagitis: Secondary | ICD-10-CM | POA: Insufficient documentation

## 2024-08-26 DIAGNOSIS — R600 Localized edema: Secondary | ICD-10-CM | POA: Diagnosis not present

## 2024-08-26 DIAGNOSIS — I10 Essential (primary) hypertension: Secondary | ICD-10-CM | POA: Diagnosis not present

## 2024-08-26 DIAGNOSIS — E1169 Type 2 diabetes mellitus with other specified complication: Secondary | ICD-10-CM | POA: Diagnosis not present

## 2024-08-26 DIAGNOSIS — L508 Other urticaria: Secondary | ICD-10-CM | POA: Diagnosis not present

## 2024-08-26 DIAGNOSIS — Z13820 Encounter for screening for osteoporosis: Secondary | ICD-10-CM | POA: Diagnosis not present

## 2024-08-26 DIAGNOSIS — E785 Hyperlipidemia, unspecified: Secondary | ICD-10-CM | POA: Diagnosis not present

## 2024-08-26 DIAGNOSIS — H903 Sensorineural hearing loss, bilateral: Secondary | ICD-10-CM | POA: Diagnosis not present

## 2024-08-26 DIAGNOSIS — Z1231 Encounter for screening mammogram for malignant neoplasm of breast: Secondary | ICD-10-CM | POA: Diagnosis not present

## 2024-08-26 DIAGNOSIS — Z23 Encounter for immunization: Secondary | ICD-10-CM | POA: Diagnosis not present

## 2024-08-26 DIAGNOSIS — Z1211 Encounter for screening for malignant neoplasm of colon: Secondary | ICD-10-CM | POA: Diagnosis not present

## 2024-08-26 MED ORDER — DUPILUMAB 300 MG/2ML ~~LOC~~ SOAJ
600.0000 mg | Freq: Once | SUBCUTANEOUS | Status: AC
  Administered 2024-08-26: 600 mg via SUBCUTANEOUS

## 2024-08-26 NOTE — Progress Notes (Signed)
   Follow-Up Visit   Subjective  Carrie Patel is a 89 y.o. female established patient who presents for FOLLOW UP on the diagnoses listed below:  Patient was last evaluated on 06/21/24.   Patient presents today for Dupixent  injection teaching.   The following portions of the chart were reviewed this encounter and updated as appropriate: medications, allergies, medical history  Review of Systems:  No other skin or systemic complaints except as noted in HPI or Assessment and Plan.  Objective  Well appearing patient in no apparent distress; mood and affect are within normal limits.   A focused examination was performed of the following areas: B/L thighs   Relevant exam findings are noted in the Assessment and Plan.        Assessment & Plan   DUPIXENT  INJECTION TRAINING FOR Tx OF ATOPIC DERMATITIS - Patient instructed how to complete Dupixent  Injections with Demonstration Pen - Patient completed Injections while in office at B/L Anterior Thighs, Mild erythema noted at injection site.   - Advised to continue topicals for break through Eczema Flares - Patient tolerated well and demonstrated understanding - Recommend gentle skin care. - Plan to follow up in 4 months   DUPIXENT  NDC: 9975-4084-79 LOT: [id number] EXP: 10/08/2025    No follow-ups on file.   Documentation: I have reviewed the above documentation for accuracy and completeness, and I agree with the above.  I, Kellis Mcadam Maranda, CMA, am acting as scribe for [employer], DO.   Delon Lenis, DO

## 2024-08-27 DIAGNOSIS — H903 Sensorineural hearing loss, bilateral: Secondary | ICD-10-CM | POA: Insufficient documentation

## 2024-09-05 ENCOUNTER — Encounter: Payer: Self-pay | Admitting: Dermatology

## 2024-09-05 DIAGNOSIS — E1165 Type 2 diabetes mellitus with hyperglycemia: Secondary | ICD-10-CM | POA: Diagnosis not present

## 2024-09-16 ENCOUNTER — Other Ambulatory Visit: Payer: Self-pay

## 2024-09-16 DIAGNOSIS — L209 Atopic dermatitis, unspecified: Secondary | ICD-10-CM

## 2024-09-16 MED ORDER — DUPIXENT 300 MG/2ML ~~LOC~~ SOSY: 300.0000 mg | PREFILLED_SYRINGE | SUBCUTANEOUS | 11 refills | Status: AC

## 2024-09-16 NOTE — Progress Notes (Signed)
 I spoke with patient's daughter Richmond and advised the syringes were sent in to [facility]. Daughter advised she is still very itchy. I advised she she

## 2024-09-21 ENCOUNTER — Ambulatory Visit: Admitting: Dermatology

## 2024-10-01 DIAGNOSIS — M170 Bilateral primary osteoarthritis of knee: Secondary | ICD-10-CM | POA: Insufficient documentation

## 2024-10-05 DIAGNOSIS — E1165 Type 2 diabetes mellitus with hyperglycemia: Secondary | ICD-10-CM | POA: Diagnosis not present

## 2024-10-09 DIAGNOSIS — J208 Acute bronchitis due to other specified organisms: Secondary | ICD-10-CM | POA: Insufficient documentation

## 2024-10-12 ENCOUNTER — Encounter (INDEPENDENT_AMBULATORY_CARE_PROVIDER_SITE_OTHER): Payer: 59 | Admitting: Ophthalmology

## 2024-10-14 ENCOUNTER — Encounter (INDEPENDENT_AMBULATORY_CARE_PROVIDER_SITE_OTHER): Admitting: Ophthalmology

## 2024-10-14 DIAGNOSIS — E113393 Type 2 diabetes mellitus with moderate nonproliferative diabetic retinopathy without macular edema, bilateral: Secondary | ICD-10-CM | POA: Diagnosis not present

## 2024-10-14 DIAGNOSIS — Z794 Long term (current) use of insulin: Secondary | ICD-10-CM

## 2024-10-14 DIAGNOSIS — H35033 Hypertensive retinopathy, bilateral: Secondary | ICD-10-CM | POA: Diagnosis not present

## 2024-10-14 DIAGNOSIS — H43813 Vitreous degeneration, bilateral: Secondary | ICD-10-CM

## 2024-10-14 DIAGNOSIS — I10 Essential (primary) hypertension: Secondary | ICD-10-CM

## 2024-10-14 DIAGNOSIS — H35371 Puckering of macula, right eye: Secondary | ICD-10-CM

## 2024-11-08 DEATH — deceased

## 2024-12-15 ENCOUNTER — Other Ambulatory Visit: Payer: Self-pay | Admitting: Family Medicine

## 2024-12-15 ENCOUNTER — Ambulatory Visit
Admission: RE | Admit: 2024-12-15 | Discharge: 2024-12-15 | Disposition: A | Discharge status: Home / Self Care | Source: Ambulatory Visit | Attending: Family Medicine | Admitting: Family Medicine

## 2024-12-15 DIAGNOSIS — J159 Unspecified bacterial pneumonia: Secondary | ICD-10-CM

## 2024-12-22 DIAGNOSIS — J90 Pleural effusion, not elsewhere classified: Secondary | ICD-10-CM | POA: Insufficient documentation

## 2024-12-22 DIAGNOSIS — L309 Dermatitis, unspecified: Secondary | ICD-10-CM | POA: Insufficient documentation

## 2024-12-22 DIAGNOSIS — E1165 Type 2 diabetes mellitus with hyperglycemia: Secondary | ICD-10-CM | POA: Insufficient documentation

## 2024-12-28 ENCOUNTER — Encounter: Payer: Self-pay | Admitting: Dermatology

## 2024-12-28 ENCOUNTER — Ambulatory Visit: Admitting: Dermatology

## 2024-12-28 VITALS — BP 131/82 | HR 65

## 2024-12-28 DIAGNOSIS — L299 Pruritus, unspecified: Secondary | ICD-10-CM | POA: Diagnosis not present

## 2024-12-28 DIAGNOSIS — L209 Atopic dermatitis, unspecified: Secondary | ICD-10-CM | POA: Diagnosis not present

## 2024-12-28 MED ORDER — NEMLUVIO 30 MG ~~LOC~~ AUIJ: 60.0000 mg | AUTO-INJECTOR | SUBCUTANEOUS | 0 refills | Status: AC

## 2024-12-28 MED ORDER — TACROLIMUS 0.1 % EX OINT: TOPICAL_OINTMENT | CUTANEOUS | 9 refills | Status: AC

## 2024-12-28 MED ORDER — NEMLUVIO 30 MG ~~LOC~~ AUIJ: 30.0000 mg | AUTO-INJECTOR | SUBCUTANEOUS | 11 refills | Status: AC

## 2024-12-28 NOTE — Patient Instructions (Addendum)
 VISIT SUMMARY:  During your visit, we discussed the challenges you are facing in managing your eczema, particularly with the administration of Dupixent  injections. We reviewed your current treatment regimen and explored alternative options to improve your condition and reduce discomfort.  YOUR PLAN:  -ATOPIC DERMATITIS:  Atopic dermatitis is a chronic skin condition that causes inflammation and itching. We understand that the Dupixent  injections have been difficult for you due to pain and anxiety.   Therefore, we are considering Nemluvio , which has a different mechanism of action and is administered monthly, potentially improving your adherence and quality of life.   Continue using clobetasol  and Cerave for two weeks, then switch to tacrolimus  for the next two weeks.    We have initiated Nemluvio  for itch management, pending insurance approval.   We will use the [employer] system for communication and approval. Additionally, using ice and numbing cream can help alleviate injection discomfort.  INSTRUCTIONS:  Please continue with your current regimen of clobetasol  and Cerave for two weeks, followed by tacrolimus  for the next two weeks.  We have initiated Nemluvio  for itch management, pending insurance approval. We will use the Tandem system for communication and approval. Additionally, using ice and numbing cream can help alleviate injection discomfort.    Important Information   Due to recent changes in healthcare laws, you may see results of your pathology and/or laboratory studies on MyChart before the doctors have had a chance to review them. We understand that in some cases there may be results that are confusing or concerning to you. Please understand that not all results are received at the same time and often the doctors may need to interpret multiple results in order to provide you with the best plan of care or course of treatment. Therefore, we ask that you please give us  2 business  days to thoroughly review all your results before contacting the office for clarification. Should we see a critical lab result, you will be contacted sooner.     If You Need Anything After Your Visit   If you have any questions or concerns for your doctor, please call our main line at [phone]. If no one answers, please leave a voicemail as directed and we will return your call as soon as possible. Messages left after 4 pm will be answered the following business day.    You may also send us  a message via MyChart. We typically respond to MyChart messages within 1-2 business days.  For prescription refills, please ask your pharmacy to contact our office. Our fax number is [phone].  If you have an urgent issue when the clinic is closed that cannot wait until the next business day, you can page your doctor at the number below.     Please note that while we do our best to be available for urgent issues outside of office hours, we are not available 24/7.    If you have an urgent issue and are unable to reach us , you may choose to seek medical care at your doctor's office, retail clinic, urgent care center, or emergency room.   If you have a medical emergency, please immediately call 911 or go to the emergency department. In the event of inclement weather, please call our main line at [phone] for an update on the status of any delays or closures.  Dermatology Medication Tips: Please keep the boxes that topical medications come in in order to help keep track of the instructions about where and how to use  these. Pharmacies typically print the medication instructions only on the boxes and not directly on the medication tubes.   If your medication is too expensive, please contact our office at [phone] or send us  a message through MyChart.    We are unable to tell what your co-pay for medications will be in advance as this is different depending on your insurance coverage. However, we  may be able to find a substitute medication at lower cost or fill out paperwork to get insurance to cover a needed medication.    If a prior authorization is required to get your medication covered by your insurance company, please allow us  1-2 business days to complete this process.   Drug prices often vary depending on where the prescription is filled and some pharmacies may offer cheaper prices.   The website www.goodrx.com contains coupons for medications through different pharmacies. The prices here do not account for what the cost may be with help from insurance (it may be cheaper with your insurance), but the website can give you the price if you did not use any insurance.  - You can print the associated coupon and take it with your prescription to the pharmacy.  - You may also stop by our office during regular business hours and pick up a GoodRx coupon card.  - If you need your prescription sent electronically to a different pharmacy, notify our office through [facility] or by phone at [phone]

## 2024-12-28 NOTE — Progress Notes (Signed)
" ° °  Follow-Up Visit   Subjective  Carrie Patel is a 89 y.o. female who presents for the following:Atopic Dermatitis (Eczema)  Patient present today for follow up visit for Eczema. Patient was last evaluated on 08/26/2024. At this visit patient completed Dupixent  Initiation. Daughter reports she stopped the injections because they were too painful for her mom. Daughter reports she is currently using Clobetasol  Cream. She rates her itch 10 out of 10. Patient reports sxs are unchanged. Her daughter reports the severity of her condition has negatively impacted her quality of life. Patient reports medication changes.  Patient has previously tried and failed the following medications: - Triamcinolone  0.025% Ointment 02/2021 - 12/2021 - Hydroxyzine 50 mg Tablets 12/2018 - 05/2024 - Methotrexate 2.5 mg tablets 12/2018 - 06/2022 - Triamcinolone  0.1% 05/2022 - 12/2023 - Xyzal 5 mg tablet 05/2022 - Current - Benadryl 50 mg 12/2018 - Current - Tacrolimus  03/2024 - Current - Clobetasol  Cream 0.05% 03/2024 - Current - Famotidine  Injection 06/2024 - Dupixent  08/2024 - 12/2024  Patient provided verbal consent for the use of an AI-assisted program to generate a detailed after-visit summary. The patient understands that the AI tool is used to support clinical documentation and that all information will be reviewed and verified by the healthcare provider.  The following portions of the chart were reviewed this encounter and updated as appropriate: medications, allergies, medical history  Review of Systems:  No other skin or systemic complaints except as noted in HPI or Assessment and Plan.  Objective  Well appearing patient in no apparent distress; mood and affect are within normal limits.  A full examination was performed including scalp, head, eyes, ears, nose, lips, neck, chest, axillae, abdomen, back, buttocks, bilateral upper extremities, bilateral lower extremities, hands, feet, fingers, toes, fingernails, and  toenails. All findings within normal limits unless otherwise noted below.   Relevant exam findings are noted in the Assessment and Plan.          Assessment & Plan   ATOPIC DERMATITIS and UNCONTROLLED PRURITUS Exam: Scaly pink papules coalescing to plaques 80% BSA  Chronic atopic dermatitis with significant pruritus. Dupixent  was initiated but discontinued due to injection site pain and anxiety. Current management with topical clobetasol  and Cerave is partially effective. Considering Nimluvio due to its different mechanism of action and reduced injection frequency. Discussed potential side effects of JAK inhibitors, including increased risk of blood clots and strokes, especially in older patients with comorbidities. Nimluvio offers a non-burning injection experience and is administered monthly, which may improve adherence and quality of life. - Continue clobetasol  and Cerave regimen: two weeks on clobetasol , two weeks off, alternating with tacrolimus . - Prescribed tacrolimus  for use during clobetasol  break. - Apply numbing cream one hour before injections to reduce discomfort. - Initiated Nimluvio for [employer], pending insurance approval. - Utilize Tandem system for communication and approval process. - Educated on the use of ice and numbing cream to alleviate injection discomfort. ATOPIC DERMATITIS, UNSPECIFIED TYPE   Existing Treatments - dupilumab  (DUPIXENT ) 300 MG/2ML prefilled syringe - Inject 300 mg into the skin every 14 (fourteen) days. Starting at day 15 for maintenance.  Return in 4 months (on 04/27/2025) for Eczema F/U.  I, Jetta Ager, am acting as neurosurgeon for [employer], DO.  Documentation: I have reviewed the above documentation for accuracy and completeness, and I agree with the above.  Delon Lenis, DO   "

## 2025-05-03 ENCOUNTER — Ambulatory Visit: Admitting: Dermatology

## 2025-10-18 ENCOUNTER — Encounter (INDEPENDENT_AMBULATORY_CARE_PROVIDER_SITE_OTHER): Admitting: Ophthalmology
